# Patient Record
Sex: Female | Born: 1970 | Race: White | Hispanic: No | Marital: Married | Smoking: Never smoker
Health system: Southern US, Community
[De-identification: ages and names within clinical notes are randomized; demographics above are authoritative.]

## PROBLEM LIST (undated history)

## (undated) DIAGNOSIS — K219 Gastro-esophageal reflux disease without esophagitis: Secondary | ICD-10-CM

## (undated) DIAGNOSIS — R519 Headache, unspecified: Secondary | ICD-10-CM

## (undated) DIAGNOSIS — I10 Essential (primary) hypertension: Secondary | ICD-10-CM

## (undated) DIAGNOSIS — R112 Nausea with vomiting, unspecified: Secondary | ICD-10-CM

## (undated) DIAGNOSIS — N3010 Interstitial cystitis (chronic) without hematuria: Secondary | ICD-10-CM

## (undated) DIAGNOSIS — D259 Leiomyoma of uterus, unspecified: Secondary | ICD-10-CM

## (undated) DIAGNOSIS — K429 Umbilical hernia without obstruction or gangrene: Secondary | ICD-10-CM

## (undated) DIAGNOSIS — R7303 Prediabetes: Secondary | ICD-10-CM

## (undated) DIAGNOSIS — K5790 Diverticulosis of intestine, part unspecified, without perforation or abscess without bleeding: Secondary | ICD-10-CM

## (undated) DIAGNOSIS — R17 Unspecified jaundice: Secondary | ICD-10-CM

## (undated) DIAGNOSIS — T7840XA Allergy, unspecified, initial encounter: Secondary | ICD-10-CM

## (undated) DIAGNOSIS — S83289A Other tear of lateral meniscus, current injury, unspecified knee, initial encounter: Secondary | ICD-10-CM

## (undated) DIAGNOSIS — R748 Abnormal levels of other serum enzymes: Secondary | ICD-10-CM

## (undated) DIAGNOSIS — Z9889 Other specified postprocedural states: Secondary | ICD-10-CM

## (undated) DIAGNOSIS — K8020 Calculus of gallbladder without cholecystitis without obstruction: Secondary | ICD-10-CM

## (undated) DIAGNOSIS — F40240 Claustrophobia: Secondary | ICD-10-CM

## (undated) DIAGNOSIS — R740 Nonspecific elevation of levels of transaminase and lactic acid dehydrogenase [LDH]: Secondary | ICD-10-CM

## (undated) DIAGNOSIS — R51 Headache: Secondary | ICD-10-CM

## (undated) DIAGNOSIS — M1710 Unilateral primary osteoarthritis, unspecified knee: Secondary | ICD-10-CM

## (undated) DIAGNOSIS — M7121 Synovial cyst of popliteal space [Baker], right knee: Secondary | ICD-10-CM

## (undated) HISTORY — DX: Essential (primary) hypertension: I10

## (undated) HISTORY — DX: Diverticulosis of intestine, part unspecified, without perforation or abscess without bleeding: K57.90

## (undated) HISTORY — DX: Interstitial cystitis (chronic) without hematuria: N30.10

## (undated) HISTORY — DX: Allergy, unspecified, initial encounter: T78.40XA

---

## 1996-07-10 HISTORY — PX: COLONOSCOPY: SHX174

## 2016-02-21 DIAGNOSIS — D259 Leiomyoma of uterus, unspecified: Secondary | ICD-10-CM | POA: Insufficient documentation

## 2016-07-10 DIAGNOSIS — D259 Leiomyoma of uterus, unspecified: Secondary | ICD-10-CM

## 2016-07-10 HISTORY — PX: LIVER BIOPSY: SHX301

## 2016-07-10 HISTORY — DX: Leiomyoma of uterus, unspecified: D25.9

## 2016-11-07 HISTORY — PX: FINGER SURGERY: SHX640

## 2016-11-09 DIAGNOSIS — S62635B Displaced fracture of distal phalanx of left ring finger, initial encounter for open fracture: Secondary | ICD-10-CM | POA: Insufficient documentation

## 2016-11-10 ENCOUNTER — Ambulatory Visit (INDEPENDENT_AMBULATORY_CARE_PROVIDER_SITE_OTHER): Payer: Self-pay | Admitting: Orthopaedic Surgery

## 2017-03-02 ENCOUNTER — Encounter: Payer: Self-pay | Admitting: Gastroenterology

## 2017-03-20 ENCOUNTER — Encounter (INDEPENDENT_AMBULATORY_CARE_PROVIDER_SITE_OTHER): Payer: Self-pay

## 2017-03-20 ENCOUNTER — Encounter: Payer: Self-pay | Admitting: Gastroenterology

## 2017-03-20 ENCOUNTER — Ambulatory Visit (INDEPENDENT_AMBULATORY_CARE_PROVIDER_SITE_OTHER): Payer: PRIVATE HEALTH INSURANCE | Admitting: Gastroenterology

## 2017-03-20 VITALS — BP 120/82 | HR 78 | Ht 63.0 in | Wt 167.4 lb

## 2017-03-20 DIAGNOSIS — Z1211 Encounter for screening for malignant neoplasm of colon: Secondary | ICD-10-CM | POA: Diagnosis not present

## 2017-03-20 NOTE — Patient Instructions (Addendum)
Check with your insurance company and call back to schedule.

## 2017-03-20 NOTE — Progress Notes (Signed)
     03/20/2017 Angel Humphrey [id number] 1971-04-20   HISTORY OF PRESENT ILLNESS:  This is a 46 year old female who is new to our practice. She was sent here at the request of her PCP in order to discuss need for colonoscopy. She had a colonoscopy in [place] by Dr. Algis Greenhouse in 2005 at which time she reports only having diverticulosis. She does not have any GI complaints. She says that her family doctor recommended colonoscopy based on the new cancer society screening guidelines of age 28. She does not have a history of colon polyps as stated above and has no family history of colon cancer.   Past Medical History:  Diagnosis Date  . Diverticulosis   . Hypertension   . Interstitial cystitis    Past Surgical History:  Procedure Laterality Date  . CESAREAN SECTION      reports that she has never smoked. She has never used smokeless tobacco. She reports that she does not drink alcohol or use drugs. family history includes Graves' disease in her father; Osteosarcoma in her father. No Known Allergies    Outpatient Encounter Prescriptions as of 03/20/2017  Medication Sig  . hydrochlorothiazide (MICROZIDE) 12.5 MG capsule Take 12.5 mg by mouth daily.  Marland Kitchen losartan-hydrochlorothiazide (HYZAAR) 50-12.5 MG tablet Take 1 tablet by mouth daily.  . norethindrone (MICRONOR,CAMILA,ERRIN) 0.35 MG tablet Take 1 tablet by mouth daily.  Marland Kitchen omeprazole (PRILOSEC) 40 MG capsule Take 40 mg by mouth daily.   No facility-administered encounter medications on file as of 03/20/2017.      REVIEW OF SYSTEMS  : All other systems reviewed and negative except where noted in the History of Present Illness.   PHYSICAL EXAM: BP 120/82 (BP Location: Left Arm, Patient Position: Sitting, Cuff Size: Normal)   Pulse 78   Ht 5\' 3"  (1.6 m)   Wt 167 lb 6.4 oz (75.9 kg)   SpO2 98%   BMI 29.65 kg/m  General: Well developed white female in no acute distress Head: Normocephalic and atraumatic Eyes:  Sclerae anicteric,  conjunctiva pink. Ears: Normal auditory acuity Lungs: Clear throughout to auscultation; no increased WOB. Heart: Regular rate and rhythm Abdomen: Soft, non-distended. Normal bowel sounds.  Non-tender. Musculoskeletal: Symmetrical with no gross deformities  Skin: No lesions on visible extremities Extremities: No edema  Neurological: Alert oriented x 4, grossly non-focal Psychological:  Alert and cooperative. Normal mood and affect  ASSESSMENT AND PLAN: -Screening colonoscopy:  I do not think that colonoscopy is warranted at this time.  She is going to check with her insurance company to see if this would even be covered. If it will be covered and she decides to proceed then she can call back to schedule an appointment.   CC:  Dr. Laney Pastor

## 2017-03-20 NOTE — Progress Notes (Signed)
Reviewed and agree with initial management plan. OK to proceed with screening colonoscopy since she is over age 48 if she would like to proceed.  Await her check with her insurance provider.   Pricilla Riffle. Fuller Plan, MD [facility]

## 2017-05-10 DIAGNOSIS — K429 Umbilical hernia without obstruction or gangrene: Secondary | ICD-10-CM

## 2017-05-10 HISTORY — DX: Umbilical hernia without obstruction or gangrene: K42.9

## 2017-06-01 ENCOUNTER — Emergency Department (HOSPITAL_COMMUNITY): Payer: PRIVATE HEALTH INSURANCE

## 2017-06-01 ENCOUNTER — Inpatient Hospital Stay (HOSPITAL_COMMUNITY)
Admission: EM | Admit: 2017-06-01 | Discharge: 2017-06-05 | DRG: 441 | Disposition: A | Discharge status: Home / Self Care | Payer: PRIVATE HEALTH INSURANCE | Attending: Family Medicine | Admitting: Family Medicine

## 2017-06-01 ENCOUNTER — Other Ambulatory Visit: Payer: Self-pay

## 2017-06-01 ENCOUNTER — Encounter (HOSPITAL_COMMUNITY): Payer: Self-pay | Admitting: *Deleted

## 2017-06-01 DIAGNOSIS — K859 Acute pancreatitis without necrosis or infection, unspecified: Secondary | ICD-10-CM | POA: Diagnosis present

## 2017-06-01 DIAGNOSIS — R52 Pain, unspecified: Secondary | ICD-10-CM

## 2017-06-01 DIAGNOSIS — N390 Urinary tract infection, site not specified: Secondary | ICD-10-CM

## 2017-06-01 DIAGNOSIS — I10 Essential (primary) hypertension: Secondary | ICD-10-CM | POA: Diagnosis present

## 2017-06-01 DIAGNOSIS — R17 Unspecified jaundice: Secondary | ICD-10-CM | POA: Diagnosis not present

## 2017-06-01 DIAGNOSIS — N3010 Interstitial cystitis (chronic) without hematuria: Secondary | ICD-10-CM | POA: Diagnosis present

## 2017-06-01 DIAGNOSIS — Z79899 Other long term (current) drug therapy: Secondary | ICD-10-CM

## 2017-06-01 DIAGNOSIS — R7989 Other specified abnormal findings of blood chemistry: Secondary | ICD-10-CM | POA: Diagnosis present

## 2017-06-01 DIAGNOSIS — D259 Leiomyoma of uterus, unspecified: Secondary | ICD-10-CM | POA: Diagnosis present

## 2017-06-01 DIAGNOSIS — R740 Nonspecific elevation of levels of transaminase and lactic acid dehydrogenase [LDH]: Secondary | ICD-10-CM

## 2017-06-01 DIAGNOSIS — R823 Hemoglobinuria: Secondary | ICD-10-CM | POA: Diagnosis present

## 2017-06-01 DIAGNOSIS — E876 Hypokalemia: Secondary | ICD-10-CM | POA: Diagnosis present

## 2017-06-01 DIAGNOSIS — K429 Umbilical hernia without obstruction or gangrene: Secondary | ICD-10-CM | POA: Diagnosis present

## 2017-06-01 DIAGNOSIS — R7401 Elevation of levels of liver transaminase levels: Secondary | ICD-10-CM

## 2017-06-01 DIAGNOSIS — B179 Acute viral hepatitis, unspecified: Secondary | ICD-10-CM

## 2017-06-01 DIAGNOSIS — L299 Pruritus, unspecified: Secondary | ICD-10-CM | POA: Diagnosis present

## 2017-06-01 DIAGNOSIS — Z808 Family history of malignant neoplasm of other organs or systems: Secondary | ICD-10-CM

## 2017-06-01 LAB — PREGNANCY, URINE: Preg Test, Ur: NEGATIVE

## 2017-06-01 LAB — ETHANOL

## 2017-06-01 LAB — COMPREHENSIVE METABOLIC PANEL
ALT: 163 U/L — AB (ref 14–54)
AST: 78 U/L — AB (ref 15–41)
Albumin: 3.6 g/dL (ref 3.5–5.0)
Alkaline Phosphatase: 328 U/L — ABNORMAL HIGH (ref 38–126)
Anion gap: 11 (ref 5–15)
BUN: 7 mg/dL (ref 6–20)
CHLORIDE: 98 mmol/L — AB (ref 101–111)
CO2: 25 mmol/L (ref 22–32)
CREATININE: 0.6 mg/dL (ref 0.44–1.00)
Calcium: 9.1 mg/dL (ref 8.9–10.3)
GFR calc non Af Amer: >60 mL/min (ref >60)
Glucose, Bld: 139 mg/dL — ABNORMAL HIGH (ref 65–99)
Potassium: 2.6 mmol/L — CL (ref 3.5–5.1)
SODIUM: 134 mmol/L — AB (ref 135–145)
Total Bilirubin: 6.9 mg/dL — ABNORMAL HIGH (ref 0.3–1.2)
Total Protein: 7.7 g/dL (ref 6.5–8.1)

## 2017-06-01 LAB — CBC
HEMATOCRIT: 39.5 % (ref 36.0–46.0)
HEMOGLOBIN: 12.8 g/dL (ref 12.0–15.0)
MCH: 28.2 pg (ref 26.0–34.0)
MCHC: 32.4 g/dL (ref 30.0–36.0)
MCV: 87 fL (ref 78.0–100.0)
Platelets: 311 10*3/uL (ref 150–400)
RBC: 4.54 MIL/uL (ref 3.87–5.11)
RDW: 13.1 % (ref 11.5–15.5)
WBC: 10.7 10*3/uL — AB (ref 4.0–10.5)

## 2017-06-01 LAB — I-STAT CG4 LACTIC ACID, ED: LACTIC ACID, VENOUS: 1.3 mmol/L (ref 0.5–1.9)

## 2017-06-01 LAB — ACETAMINOPHEN LEVEL

## 2017-06-01 LAB — MAGNESIUM: MAGNESIUM: 1.8 mg/dL (ref 1.7–2.4)

## 2017-06-01 LAB — LIPASE, BLOOD: LIPASE: 177 U/L — AB (ref 11–51)

## 2017-06-01 MED ORDER — IOPAMIDOL (ISOVUE-300) INJECTION 61%
100.0000 mL | Freq: Once | INTRAVENOUS | Status: AC | PRN
  Administered 2017-06-01: 100 mL via INTRAVENOUS

## 2017-06-01 MED ORDER — SODIUM CHLORIDE 0.9 % IV SOLN
INTRAVENOUS | Status: DC
  Administered 2017-06-01: via INTRAVENOUS

## 2017-06-01 MED ORDER — POTASSIUM CHLORIDE 10 MEQ/100ML IV SOLN
10.0000 meq | INTRAVENOUS | Status: AC
  Administered 2017-06-01 – 2017-06-02 (×3): 10 meq via INTRAVENOUS
  Filled 2017-06-01 (×3): qty 100

## 2017-06-01 MED ORDER — POTASSIUM CHLORIDE CRYS ER 20 MEQ PO TBCR
40.0000 meq | EXTENDED_RELEASE_TABLET | Freq: Once | ORAL | Status: AC
  Administered 2017-06-01: 40 meq via ORAL
  Filled 2017-06-01: qty 2

## 2017-06-01 NOTE — ED Provider Notes (Signed)
[facility] EMERGENCY DEPARTMENT Provider Note   CSN: [id number] Arrival date & time: 06/01/17  2051     History   Chief Complaint Chief Complaint  Patient presents with  . Abdominal Pain    HPI Angel Humphrey is a 46 y.o. female.  Patient with past medical history of hypertension presenting with 5-day history of upper abdominal pain and nausea.  She was sent here from urgent care when he noticed she was jaundiced.  She denies any Fever or history of stomach problems or liver problems.  Patient states symptoms started 5 days ago with nausea and "acid reflux" sour taste in her mouth.  She has had poor appetite all week with discomfort in her epigastrium nausea.  No vomiting.  Did have constipation earlier in the week but now having loose and lightly colored stools.  Denies sick contacts.  Denies recent travel.  Denies IV drug abuse or excessive alcohol use.  No previous abdominal surgeries.  No chest pain or shortness of breath.  Urine has been dark but nonpainful with urination and no hematuria.   The history is provided by the patient.  Abdominal Pain   Associated symptoms include diarrhea, nausea and constipation. Pertinent negatives include fever, vomiting, dysuria, hematuria, headaches, arthralgias and myalgias.    Past Medical History:  Diagnosis Date  . Diverticulosis   . Hypertension   . Interstitial cystitis     Patient Active Problem List   Diagnosis Date Noted  . Special screening for malignant neoplasms, colon 03/20/2017    Past Surgical History:  Procedure Laterality Date  . CESAREAN SECTION      OB History    No data available       Home Medications    Prior to Admission medications   Medication Sig Start Date End Date Taking? Authorizing Provider  hydrochlorothiazide (MICROZIDE) 12.5 MG capsule Take 12.5 mg by mouth daily.    [provider]  losartan-hydrochlorothiazide (HYZAAR) 50-12.5 MG tablet Take 1 tablet by mouth daily.    [provider]  norethindrone (MICRONOR,CAMILA,ERRIN) 0.35 MG tablet Take 1 tablet by mouth daily.    [provider]  omeprazole (PRILOSEC) 40 MG capsule Take 40 mg by mouth daily.    [provider]    Family History Family History  Problem Relation Age of Onset  . Osteosarcoma Father   . Graves' disease Father     Social History Social History   Tobacco Use  . Smoking status: Never Smoker  . Smokeless tobacco: Never Used  Substance Use Topics  . Alcohol use: No  . Drug use: No     Allergies   Patient has no known allergies.   Review of Systems Review of Systems  Constitutional: Positive for activity change, appetite change and fatigue. Negative for fever.  Respiratory: Negative for cough, chest tightness and shortness of breath.   Cardiovascular: Negative for chest pain.  Gastrointestinal: Positive for abdominal pain, constipation, diarrhea and nausea. Negative for vomiting.  Genitourinary: Negative for dysuria, hematuria, vaginal bleeding and vaginal discharge.  Musculoskeletal: Negative for arthralgias and myalgias.  Skin: Negative for rash.  Neurological: Positive for weakness. Negative for dizziness and headaches.    all other systems are negative except as noted in the HPI and PMH.    Physical Exam Updated Vital Signs BP 129/80 (BP Location: Left Arm)   Pulse 81   Temp 98.4 F (36.9 C) (Oral)   Resp 18   Ht 5\' 3"  (1.6 m)   Wt  78 kg (172 lb)   SpO2 99%   BMI 30.47 kg/m   Physical Exam  Constitutional: She is oriented to person, place, and time. She appears well-developed and well-nourished. No distress.  jaundice  HENT:  Head: Normocephalic and atraumatic.  Mouth/Throat: Oropharynx is clear and moist. No oropharyngeal exudate.  Eyes: Conjunctivae and EOM are normal. Pupils are equal, round, and reactive to light. Scleral icterus is present.  Neck: Normal range of motion. Neck supple.  No meningismus.  Cardiovascular: Normal  rate, regular rhythm, normal heart sounds and intact distal pulses.  No murmur heard. Pulmonary/Chest: Effort normal and breath sounds normal. No respiratory distress.  Abdominal: Soft. There is tenderness. There is no rebound and no guarding.  TTP epigastrium and RUQ. No guarding or rebound  Musculoskeletal: Normal range of motion. She exhibits no edema or tenderness.  Neurological: She is alert and oriented to person, place, and time. No cranial nerve deficit. She exhibits normal muscle tone. Coordination normal.  No ataxia on finger to nose bilaterally. No pronator drift. 5/5 strength throughout. CN 2-12 intact.Equal grip strength. Sensation intact.   Skin: Skin is warm.  Psychiatric: She has a normal mood and affect. Her behavior is normal.  Nursing note and vitals reviewed.    ED Treatments / Results  Labs (all labs ordered are listed, but only abnormal results are displayed) Labs Reviewed  LIPASE, BLOOD - Abnormal; Notable for the following components:      Result Value   Lipase 177 (*)    All other components within normal limits  COMPREHENSIVE METABOLIC PANEL - Abnormal; Notable for the following components:   Sodium 134 (*)    Potassium 2.6 (*)    Chloride 98 (*)    Glucose, Bld 139 (*)    AST 78 (*)    ALT 163 (*)    Alkaline Phosphatase 328 (*)    Total Bilirubin 6.9 (*)    All other components within normal limits  CBC - Abnormal; Notable for the following components:   WBC 10.7 (*)    All other components within normal limits  URINALYSIS, ROUTINE W REFLEX MICROSCOPIC - Abnormal; Notable for the following components:   Color, Urine AMBER (*)    Hgb urine dipstick SMALL (*)    Bilirubin Urine SMALL (*)    Leukocytes, UA SMALL (*)    Bacteria, UA FEW (*)    Squamous Epithelial / LPF 0-5 (*)    All other components within normal limits  ACETAMINOPHEN LEVEL - Abnormal; Notable for the following components:   Acetaminophen (Tylenol), Serum <10 (*)    All other  components within normal limits  BILIRUBIN, DIRECT - Abnormal; Notable for the following components:   Bilirubin, Direct 4.2 (*)    All other components within normal limits  ETHANOL  PREGNANCY, URINE  MAGNESIUM  PHOSPHORUS  HEPATITIS PANEL, ACUTE  CBC WITH DIFFERENTIAL/PLATELET  PROTIME-INR  HEPATIC FUNCTION PANEL  BASIC METABOLIC PANEL  I-STAT CG4 LACTIC ACID, ED  I-STAT CG4 LACTIC ACID, ED    EKG  EKG Interpretation None       Radiology Ct Abdomen Pelvis W Contrast  Result Date: 06/02/2017 CLINICAL DATA:  Upper abdominal pain and nausea for 5 days. Jaundice. EXAM: CT ABDOMEN AND PELVIS WITH CONTRAST TECHNIQUE: Multidetector CT imaging of the abdomen and pelvis was performed using the standard protocol following bolus administration of intravenous contrast. CONTRAST:  124mL ISOVUE-300 IOPAMIDOL (ISOVUE-300) INJECTION 61% COMPARISON:  None. FINDINGS: Lower chest: Lung bases are clear. Hepatobiliary:  No focal liver abnormality is seen. No gallstones, gallbladder wall thickening, or biliary dilatation. Pancreas: Unremarkable. No pancreatic ductal dilatation or surrounding inflammatory changes. Spleen: Normal in size without focal abnormality. Adrenals/Urinary Tract: Adrenal glands are unremarkable. Kidneys are normal, without renal calculi, focal lesion, or hydronephrosis. Bladder is unremarkable. Stomach/Bowel: Stomach is within normal limits. Appendix is not identified. No evidence of bowel wall thickening, distention, or inflammatory changes. Scattered colonic diverticula without evidence of diverticulitis. Vascular/Lymphatic: No significant vascular findings are present. No enlarged abdominal or pelvic lymph nodes. Reproductive: Heterogeneous nodular appearance to the uterus probably representing small fibroids. Uterus and ovaries are not enlarged. Other: Small periumbilical hernia containing fat. No free air or free fluid in the abdomen. Musculoskeletal: No acute or significant  osseous findings. IMPRESSION: 1. No abnormality identified to account for history jaundice. 2. Probable uterine fibroids. 3. Small periumbilical hernia containing fat. Electronically Signed   By: Lucienne Capers M.D.   On: 06/02/2017 00:09    Procedures Procedures (including critical care time)  Medications Ordered in ED Medications - No data to display   Initial Impression / Assessment and Plan / ED Course  I have reviewed the triage vital signs and the nursing notes.  Pertinent labs & imaging results that were available during my care of the patient were reviewed by me and considered in my medical decision making (see chart for details).    Patient with 5-day history of epigastric pain nausea and poor appetite, now with jaundice.  She is nontoxic and afebrile.  Abdomen is not peritoneal.  Given IV fluids, symptom control, labs will be obtained. likely need imaging of biliary tree.  Labs show hypokalemia, elevated LFTs and lipase.  CT scan does not show any common bile duct stones or other explanation for patient's jaundice and elevated enzymes.  No fever.  Doubt cholangitis.  Patient may benefit from further evaluation of her biliary tree with ultrasound.  Suspect possible passed gallstone.  Patient appears well and nontoxic. Hepatitis panel pending.  Will plan admission for ultrasound, further workup of her hepatitis.  Discussed with Dr. Olevia Bowens. Final Clinical Impressions(s) / ED Diagnoses   Final diagnoses:  Jaundice  Transaminitis    ED Discharge Orders    None       Aiana Nordquist, Annie Main, MD 06/02/17 [phone]

## 2017-06-01 NOTE — ED Triage Notes (Signed)
Pt was seen at urgent care earlier today for upper abdominal pain and nausea x 5 days; pt states today when she woke up she noticed her skin and eyes were yellow; [facility] in [place] sent her here

## 2017-06-01 NOTE — ED Notes (Signed)
Date and time results received: 06/01/17 2317 (use smartphrase ".now" to insert current time)  Test: potassium Critical Value: 2.6  Name of Provider Notified: DR. Wyvonnia Dusky notified at 2318  Orders Received? Or Actions Taken?: no/na

## 2017-06-02 ENCOUNTER — Encounter (HOSPITAL_COMMUNITY): Payer: Self-pay | Admitting: Internal Medicine

## 2017-06-02 ENCOUNTER — Observation Stay (HOSPITAL_COMMUNITY): Payer: PRIVATE HEALTH INSURANCE

## 2017-06-02 ENCOUNTER — Other Ambulatory Visit: Payer: Self-pay

## 2017-06-02 DIAGNOSIS — R17 Unspecified jaundice: Secondary | ICD-10-CM | POA: Diagnosis present

## 2017-06-02 DIAGNOSIS — E876 Hypokalemia: Secondary | ICD-10-CM | POA: Diagnosis present

## 2017-06-02 DIAGNOSIS — N3010 Interstitial cystitis (chronic) without hematuria: Secondary | ICD-10-CM | POA: Diagnosis present

## 2017-06-02 DIAGNOSIS — I10 Essential (primary) hypertension: Secondary | ICD-10-CM | POA: Diagnosis present

## 2017-06-02 DIAGNOSIS — K859 Acute pancreatitis without necrosis or infection, unspecified: Secondary | ICD-10-CM | POA: Diagnosis present

## 2017-06-02 DIAGNOSIS — R740 Nonspecific elevation of levels of transaminase and lactic acid dehydrogenase [LDH]: Secondary | ICD-10-CM | POA: Diagnosis not present

## 2017-06-02 DIAGNOSIS — B179 Acute viral hepatitis, unspecified: Secondary | ICD-10-CM

## 2017-06-02 DIAGNOSIS — L299 Pruritus, unspecified: Secondary | ICD-10-CM | POA: Diagnosis present

## 2017-06-02 DIAGNOSIS — D259 Leiomyoma of uterus, unspecified: Secondary | ICD-10-CM | POA: Diagnosis present

## 2017-06-02 DIAGNOSIS — Z79899 Other long term (current) drug therapy: Secondary | ICD-10-CM | POA: Diagnosis not present

## 2017-06-02 DIAGNOSIS — K429 Umbilical hernia without obstruction or gangrene: Secondary | ICD-10-CM | POA: Diagnosis present

## 2017-06-02 DIAGNOSIS — R7989 Other specified abnormal findings of blood chemistry: Secondary | ICD-10-CM | POA: Diagnosis present

## 2017-06-02 DIAGNOSIS — R823 Hemoglobinuria: Secondary | ICD-10-CM | POA: Diagnosis present

## 2017-06-02 DIAGNOSIS — Z808 Family history of malignant neoplasm of other organs or systems: Secondary | ICD-10-CM | POA: Diagnosis not present

## 2017-06-02 DIAGNOSIS — N390 Urinary tract infection, site not specified: Secondary | ICD-10-CM

## 2017-06-02 LAB — URINALYSIS, ROUTINE W REFLEX MICROSCOPIC
GLUCOSE, UA: NEGATIVE mg/dL
Ketones, ur: NEGATIVE mg/dL
NITRITE: NEGATIVE
PH: 6 (ref 5.0–8.0)
Protein, ur: NEGATIVE mg/dL
SPECIFIC GRAVITY, URINE: 1.006 (ref 1.005–1.030)

## 2017-06-02 LAB — CBC WITH DIFFERENTIAL/PLATELET
BASOS ABS: 0 10*3/uL (ref 0.0–0.1)
Basophils Relative: 0 %
EOS PCT: 5 %
Eosinophils Absolute: 0.4 10*3/uL (ref 0.0–0.7)
HEMATOCRIT: 39.1 % (ref 36.0–46.0)
Hemoglobin: 12.6 g/dL (ref 12.0–15.0)
LYMPHS ABS: 0.7 10*3/uL (ref 0.7–4.0)
LYMPHS PCT: 8 %
MCH: 28.3 pg (ref 26.0–34.0)
MCHC: 32.2 g/dL (ref 30.0–36.0)
MCV: 87.9 fL (ref 78.0–100.0)
Monocytes Absolute: 0.8 10*3/uL (ref 0.1–1.0)
Monocytes Relative: 10 %
NEUTROS ABS: 6.5 10*3/uL (ref 1.7–7.7)
Neutrophils Relative %: 77 %
PLATELETS: 278 10*3/uL (ref 150–400)
RBC: 4.45 MIL/uL (ref 3.87–5.11)
RDW: 13.2 % (ref 11.5–15.5)
WBC: 8.5 10*3/uL (ref 4.0–10.5)

## 2017-06-02 LAB — BASIC METABOLIC PANEL
Anion gap: 7 (ref 5–15)
BUN: 6 mg/dL (ref 6–20)
CHLORIDE: 108 mmol/L (ref 101–111)
CO2: 24 mmol/L (ref 22–32)
CREATININE: 0.42 mg/dL — AB (ref 0.44–1.00)
Calcium: 9 mg/dL (ref 8.9–10.3)
GFR calc Af Amer: >60 mL/min (ref >60)
GFR calc non Af Amer: >60 mL/min (ref >60)
GLUCOSE: 110 mg/dL — AB (ref 65–99)
POTASSIUM: 3.4 mmol/L — AB (ref 3.5–5.1)
SODIUM: 139 mmol/L (ref 135–145)

## 2017-06-02 LAB — HEPATIC FUNCTION PANEL
ALBUMIN: 3.1 g/dL — AB (ref 3.5–5.0)
ALK PHOS: 318 U/L — AB (ref 38–126)
ALT: 147 U/L — AB (ref 14–54)
AST: 80 U/L — AB (ref 15–41)
BILIRUBIN TOTAL: 6.4 mg/dL — AB (ref 0.3–1.2)
Bilirubin, Direct: 4 mg/dL — ABNORMAL HIGH (ref 0.1–0.5)
Indirect Bilirubin: 2.4 mg/dL — ABNORMAL HIGH (ref 0.3–0.9)
TOTAL PROTEIN: 7.1 g/dL (ref 6.5–8.1)

## 2017-06-02 LAB — PHOSPHORUS: Phosphorus: 3.6 mg/dL (ref 2.5–4.6)

## 2017-06-02 LAB — PROTIME-INR
INR: 0.96
Prothrombin Time: 12.7 seconds (ref 11.4–15.2)

## 2017-06-02 LAB — BILIRUBIN, DIRECT: Bilirubin, Direct: 4.2 mg/dL — ABNORMAL HIGH (ref 0.1–0.5)

## 2017-06-02 MED ORDER — FENTANYL CITRATE (PF) 100 MCG/2ML IJ SOLN: 25.0000 ug | INTRAMUSCULAR | Status: DC | PRN

## 2017-06-02 MED ORDER — LEVOFLOXACIN IN D5W 500 MG/100ML IV SOLN
500.0000 mg | INTRAVENOUS | Status: DC
  Administered 2017-06-02: 500 mg via INTRAVENOUS
  Filled 2017-06-02: qty 100

## 2017-06-02 MED ORDER — PANTOPRAZOLE SODIUM 40 MG PO TBEC
40.0000 mg | DELAYED_RELEASE_TABLET | Freq: Every day | ORAL | Status: DC
  Administered 2017-06-02 – 2017-06-05 (×4): 40 mg via ORAL
  Filled 2017-06-02 (×4): qty 1

## 2017-06-02 MED ORDER — PROCHLORPERAZINE EDISYLATE 5 MG/ML IJ SOLN
5.0000 mg | INTRAMUSCULAR | Status: DC | PRN
  Administered 2017-06-02 – 2017-06-04 (×3): 5 mg via INTRAVENOUS
  Filled 2017-06-02 (×2): qty 2

## 2017-06-02 MED ORDER — FAMOTIDINE IN NACL 20-0.9 MG/50ML-% IV SOLN
20.0000 mg | Freq: Once | INTRAVENOUS | Status: AC
  Administered 2017-06-02: 20 mg via INTRAVENOUS
  Filled 2017-06-02: qty 50

## 2017-06-02 MED ORDER — MAGNESIUM SULFATE 2 GM/50ML IV SOLN
2.0000 g | Freq: Once | INTRAVENOUS | Status: AC
  Administered 2017-06-02: 2 g via INTRAVENOUS
  Filled 2017-06-02: qty 50

## 2017-06-02 MED ORDER — LOSARTAN POTASSIUM-HCTZ 50-12.5 MG PO TABS: 1.0000 | ORAL_TABLET | Freq: Every day | ORAL | Status: DC

## 2017-06-02 MED ORDER — POTASSIUM CHLORIDE CRYS ER 20 MEQ PO TBCR
40.0000 meq | EXTENDED_RELEASE_TABLET | ORAL | Status: AC
  Administered 2017-06-02 (×4): 40 meq via ORAL
  Filled 2017-06-02 (×4): qty 2

## 2017-06-02 MED ORDER — PROCHLORPERAZINE EDISYLATE 5 MG/ML IJ SOLN
INTRAMUSCULAR | Status: AC
  Filled 2017-06-02: qty 2

## 2017-06-02 MED ORDER — LOSARTAN POTASSIUM 50 MG PO TABS
50.0000 mg | ORAL_TABLET | Freq: Every day | ORAL | Status: DC
  Administered 2017-06-02 – 2017-06-05 (×4): 50 mg via ORAL
  Filled 2017-06-02 (×4): qty 1

## 2017-06-02 MED ORDER — NITROFURANTOIN MACROCRYSTAL 100 MG PO CAPS
100.0000 mg | ORAL_CAPSULE | Freq: Two times a day (BID) | ORAL | Status: DC
  Filled 2017-06-02 (×3): qty 1

## 2017-06-02 MED ORDER — POTASSIUM CHLORIDE IN NACL 20-0.9 MEQ/L-% IV SOLN
INTRAVENOUS | Status: DC
  Administered 2017-06-02 – 2017-06-03 (×4): via INTRAVENOUS
  Filled 2017-06-02: qty 1000

## 2017-06-02 MED ORDER — DIPHENHYDRAMINE HCL 50 MG/ML IJ SOLN
25.0000 mg | Freq: Four times a day (QID) | INTRAMUSCULAR | Status: DC | PRN
  Administered 2017-06-02 – 2017-06-05 (×10): 25 mg via INTRAVENOUS
  Filled 2017-06-02 (×10): qty 1

## 2017-06-02 MED ORDER — HYDROCHLOROTHIAZIDE 12.5 MG PO CAPS
12.5000 mg | ORAL_CAPSULE | Freq: Every day | ORAL | Status: DC
  Administered 2017-06-02 – 2017-06-05 (×4): 12.5 mg via ORAL
  Filled 2017-06-02 (×4): qty 1

## 2017-06-02 NOTE — Consult Note (Addendum)
Referring Provider: No ref. provider found Primary Care Physician:  Yvone Neu, MD Primary Gastroenterologist:  Barney Drain  Reason for Consultation:  ELEVATED LIVER ENZYMES  FULL CONSULT TO FOLLOW  Impression: ADMITTED WITH ELEVATED LIVER ENZYMES & UTI. NO EVIDENCE FOR GALLSTONES OR CBD OBSTRUCTION. MIXED PATTERN INJURY- DIFFERENTIAL DIAGNOSIS INCLUDES: NASH, LESS LIKELY ACUTE VIRAL HEPATITIS B, AUTOIMMUNE HEPATITIS, OR OVERLAP SYNDROME  Plan: 1. SUPPORTIVE CARE 2. AWAIT ACUTE HEPATITIS PANEL & U/S. 3. HFP DAILY 4. CONTINUE TO MONITOR MENTAL STATUS. 5. AVOID HEPATOTOXIC DRUGS. D/C LEVAQUIN. SWITCH TO MACRODANTIN 100 MG BID.  6. FULL LIQUID DIET.       HPI:  Pt in her usual state of health when she felt unwell on SUN NIGH MON AM. SHE FEL FATIGUED AND WEAK. SHE STARTED HAVING NAUSEA ON FRI & WENT TO URGENT CARE, THEY NOTED SHE WAS YELLOW AND SENT HER TO THE ED.  SHE HAD A CHANGE IN HER BOWEL MOVEMENTS. SHE HAD A NL BM MON BUT NOTICED SOFT CLAY COLORED STOOLS WITHIN THE LAST FEW DAYS. SHE EATS OUT A LOT AND LAST ATE OUT THUR. SHE DENIES ETOH USE OR NEW MED Rx OR OTC. SHE HAS NEVER HAD A BLOOD TRANSFUSION AND DOESN'T HAVE ANY TATTOOS. SHE HAS TROUBLE WITH CONSTIPATION.  PT DENIES FEVER, CHILLS, HEMATOCHEZIA, vomiting, melena, diarrhea, CHEST PAIN, SHORTNESS OF BREATH, abdominal pain, problems swallowing, problems with sedation, OR heartburn or indigestion.    Past Medical History:  Diagnosis Date  . Diverticulosis   . Hypertension   . Interstitial cystitis     Past Surgical History:  Procedure Laterality Date  . CESAREAN SECTION    . COLONOSCOPY  1998    Prior to Admission medications   Medication Sig Start Date End Date Taking? Authorizing Provider  hydrochlorothiazide (MICROZIDE) 12.5 MG capsule Take 12.5 mg by mouth daily.   Yes [provider]  losartan-hydrochlorothiazide (HYZAAR) 50-12.5 MG tablet Take 1 tablet by mouth daily.   Yes [provider]  norethindrone (MICRONOR,CAMILA,ERRIN) 0.35 MG tablet Take 1 tablet by mouth daily.   Yes [provider]  omeprazole (PRILOSEC) 40 MG capsule Take 40 mg by mouth daily.   Yes [provider]    Current Facility-Administered Medications  Medication Dose Route Frequency Provider Last Rate Last Dose  . 0.9 % NaCl with KCl 20 mEq/ L  infusion   Intravenous Continuous Reubin Milan, MD 125 mL/hr at 06/02/17 1100    . diphenhydrAMINE (BENADRYL) injection 25 mg  25 mg Intravenous Q6H PRN Reubin Milan, MD   25 mg at 06/02/17 1103  . fentaNYL (SUBLIMAZE) injection 25 mcg  25 mcg Intravenous Q2H PRN Reubin Milan, MD      . losartan (COZAAR) tablet 50 mg  50 mg Oral Daily Reubin Milan, MD   50 mg at 06/02/17 1105   And  . hydrochlorothiazide (MICROZIDE) capsule 12.5 mg  12.5 mg Oral Daily Reubin Milan, MD   12.5 mg at 06/02/17 1104  . levofloxacin (LEVAQUIN) IVPB 500 mg  500 mg Intravenous Q24H Reubin Milan, MD 100 mL/hr at 06/02/17 1103 500 mg at 06/02/17 1103  . pantoprazole (PROTONIX) EC tablet 40 mg  40 mg Oral Daily Reubin Milan, MD   40 mg at 06/02/17 1104  . potassium chloride SA (K-DUR,KLOR-CON) CR tablet 40 mEq  40 mEq Oral Q4H Isaac Bliss, Rayford Halsted, MD   40 mEq at 06/02/17 1104  . prochlorperazine (COMPAZINE) 5 MG/ML injection           .  prochlorperazine (COMPAZINE) injection 5 mg  5 mg Intravenous Q4H PRN Reubin Milan, MD   5 mg at 06/02/17 0118    Allergies as of 06/01/2017  . (No Known Allergies)    Family History  Problem Relation Age of Onset  . Osteosarcoma Father   . Graves' disease Father     Social History   Socioeconomic History  . Marital status: Married    Spouse name: Not on file  . Number of children: Not on file  . Years of education: Not on file  . Highest education level: Not on file  Social Needs  . Financial resource strain: Not on file  . Food insecurity - worry:  Not on file  . Food insecurity - inability: Not on file  . Transportation needs - medical: Not on file  . Transportation needs - non-medical: Not on file  Occupational History  . Not on file  Tobacco Use  . Smoking status: Never Smoker  . Smokeless tobacco: Never Used  Substance and Sexual Activity  . Alcohol use: No  . Drug use: No  . Sexual activity: Not on file  Other Topics Concern  . Not on file  Social History Narrative  . Not on file    Review of Systems: PER HPI OTHERWISE ALL SYSTEMS ARE NEGATIVE.   Vitals: Blood pressure 132/71, pulse 87, temperature 98.4 F (36.9 C), temperature source Oral, resp. rate 19, height 5\' 3"  (1.6 m), weight 170 lb 3.2 oz (77.2 kg), SpO2 98 %.  Physical Exam: General:   Alert,  Well-developed, well-nourished, pleasant and cooperative in NAD Head:  Normocephalic and atraumatic. Eyes:  Sclera clear, icterus.   Conjunctiva pink. Mouth:  No lesions, dentition normal. Neck:  Supple; no masses. Lungs:  Clear throughout to auscultation.   No wheezes. No acute distress. Heart:  Regular rate and rhythm; no murmurs. Abdomen:  Soft, MILDLY tender IN BUQs/EPIGASTRIUM, nondistended. No masses,  noted. Normal bowel sounds, without guarding, and without rebound.   Msk:  Symmetrical without gross deformities. Normal posture. Extremities:  Without edema. Neurologic:  Alert and  oriented x4;  grossly normal neurologically. Cervical Nodes:  No significant cervical adenopathy. Psych:  Alert and cooperative. Normal mood and affect.   Lab Results: PENDING ACUTE HEPATITIS PANEL Recent Labs    06/01/17 2225 06/02/17 0950  WBC 10.7* 8.5  HGB 12.8 12.6  HCT 39.5 39.1  PLT 311 278   BMET Recent Labs    06/01/17 2225 06/02/17 0950  NA 134* 139  K 2.6* 3.4*  CL 98* 108  CO2 25 24  GLUCOSE 139* 110*  BUN 7 6  CREATININE 0.60 0.42*  CALCIUM 9.1 9.0   LFT Recent Labs    06/02/17 0950  PROT 7.1  ALBUMIN 3.1*  AST 80*  ALT 147*  ALKPHOS  318*  BILITOT 6.4*  BILIDIR 4.0*  IBILI 2.4*     Studies/Results: PENDING U/S   LOS: 0 days   Arick Mareno  06/02/2017, 11:06 AM

## 2017-06-02 NOTE — H&P (Signed)
History and Physical    [name] DJS:[id number] DOB: [birth year] DOA: 06/01/2017  PCP: Yvone Neu, MD   Patient coming from: [facility], [place], [place].  I have personally briefly reviewed patient's old medical records in [place]  Chief Complaint: Abdominal pain, nausea and jaundice.  HPI: Angel Humphrey is a 46 y.o. female with medical history significant of diverticulosis, hypertension, interstitial cystitis who is coming to the emergency department referred from [facility] urgent care in [place], [place] after presenting there with complaints of 5 days of right upper abdominal quadrant pain associated with nausea, mild malaise, decreased appetite, and jaundice that she noticed today.  She denies fever, chills, sore throat, chest pain, dizziness, palpitations, diaphoresis, PND orthopnea.  She gets occasional lower extremity edema.  Denies melena, hematochezia or constipation.  She gets occasional frequency, but currently no new symptoms.  ED Course: Initial vital signs temperature 36.9C, pulse 81, respirations 18, blood pressure 129/80 and O2 sat 99% on room air.  She received potassium supplementation in the emergency department.  Her workup shows urinalysis with amber colored, small hemoglobinuria, small bilirubin, small leukocyte esterase.  Microscopic shows few bacteria with 6-30 WBC.  WBC of 10.7, hemoglobin 12.8 g/dL and platelets 311.  Her sodium was 134, potassium 2.6, CO2 25 and chloride 98 mmol/L.  AST was 78, ALT 163, alkaline phosphatase 328 and lipase 177.  Her total bilirubin was 6.9 and direct bilirubin was 4.2.  Acetaminophen, alcohol, magnesium, phosphorus and lactic acid levels were normal.  Imaging: CT abdomen/pelvis with contrast show probable uterine fibroids and small periumbilical hernia containing fat, but no abnormality that could account for jaundice.  Please see images and full radiology report for further detail.  Review of Systems: As per  HPI otherwise 10 point review of systems negative.    Past Medical History:  Diagnosis Date  . Diverticulosis   . Hypertension   . Interstitial cystitis     Past Surgical History:  Procedure Laterality Date  . CESAREAN SECTION       reports that  has never smoked. she has never used smokeless tobacco. She reports that she does not drink alcohol or use drugs.  No Known Allergies  Family History  Problem Relation Age of Onset  . Osteosarcoma Father   . Graves' disease Father     Prior to Admission medications   Medication Sig Start Date End Date Taking? Authorizing Provider  hydrochlorothiazide (MICROZIDE) 12.5 MG capsule Take 12.5 mg by mouth daily.    [provider]  losartan-hydrochlorothiazide (HYZAAR) 50-12.5 MG tablet Take 1 tablet by mouth daily.    [provider]  norethindrone (MICRONOR,CAMILA,ERRIN) 0.35 MG tablet Take 1 tablet by mouth daily.    [provider]  omeprazole (PRILOSEC) 40 MG capsule Take 40 mg by mouth daily.    [provider]    Physical Exam: Vitals:   06/01/17 2133 06/01/17 2134 06/02/17 0100 06/02/17 0144  BP: 129/80  119/67 (!) 143/61  Pulse: 81  79 83  Resp: 18  (!) 23 18  Temp: 98.4 F (36.9 C)   97.6 F (36.4 C)  TempSrc: Oral   Oral  SpO2: 99%  100% 97%  Weight:  78 kg (172 lb)  77.2 kg (170 lb 3.2 oz)  Height:  5\' 3"  (1.6 m)  5\' 3"  (1.6 m)    Constitutional: NAD, calm, comfortable Eyes: PERRL, icteric sclerae, lids and conjunctivae normal ENMT: Mucous membranes are moist. Posterior pharynx clear of any  exudate or lesions. Neck: normal, supple, no masses, no thyromegaly Respiratory: clear to auscultation bilaterally, no wheezing, no crackles. Normal respiratory effort. No accessory muscle use.  Cardiovascular: Regular rate and rhythm, no murmurs / rubs / gallops. No extremity edema. 2+ pedal pulses. No carotid bruits.  Abdomen: Soft, mild epigastric and RUQ tenderness, positive suprapubic  tenderness, no guarding/rebound/masses palpated. No hepatosplenomegaly. Bowel sounds positive.  Musculoskeletal: no clubbing / cyanosis. Good ROM, no contractures. Normal muscle tone.  Skin: Positive icterus. Neurologic: CN 2-12 grossly intact. Sensation intact, DTR normal. Strength 5/5 in all 4.  Psychiatric: Normal judgment and insight. Alert and oriented x 3. Normal mood.    Labs on Admission: I have personally reviewed following labs and imaging studies  CBC: Recent Labs  Lab 06/01/17 2225  WBC 10.7*  HGB 12.8  HCT 39.5  MCV 87.0  PLT 433   Basic Metabolic Panel: Recent Labs  Lab 06/01/17 2225  NA 134*  K 2.6*  CL 98*  CO2 25  GLUCOSE 139*  BUN 7  CREATININE 0.60  CALCIUM 9.1  MG 1.8  PHOS 3.6   GFR: Estimated Creatinine Clearance: 84.6 mL/min (by C-G formula based on SCr of 0.6 mg/dL). Liver Function Tests: Recent Labs  Lab 06/01/17 2225  AST 78*  ALT 163*  ALKPHOS 328*  BILITOT 6.9*  PROT 7.7  ALBUMIN 3.6   Recent Labs  Lab 06/01/17 2225  LIPASE 177*   No results for input(s): AMMONIA in the last 168 hours. Coagulation Profile: No results for input(s): INR, PROTIME in the last 168 hours. Cardiac Enzymes: No results for input(s): CKTOTAL, CKMB, CKMBINDEX, TROPONINI in the last 168 hours. BNP (last 3 results) No results for input(s): PROBNP in the last 8760 hours. HbA1C: No results for input(s): HGBA1C in the last 72 hours. CBG: No results for input(s): GLUCAP in the last 168 hours. Lipid Profile: No results for input(s): CHOL, HDL, LDLCALC, TRIG, CHOLHDL, LDLDIRECT in the last 72 hours. Thyroid Function Tests: No results for input(s): TSH, T4TOTAL, FREET4, T3FREE, THYROIDAB in the last 72 hours. Anemia Panel: No results for input(s): VITAMINB12, FOLATE, FERRITIN, TIBC, IRON, RETICCTPCT in the last 72 hours. Urine analysis:    Component Value Date/Time   COLORURINE AMBER (A) 06/01/2017 2247   APPEARANCEUR CLEAR 06/01/2017 2247   LABSPEC  1.006 06/01/2017 2247   PHURINE 6.0 06/01/2017 2247   GLUCOSEU NEGATIVE 06/01/2017 2247   HGBUR SMALL (A) 06/01/2017 2247   BILIRUBINUR SMALL (A) 06/01/2017 2247   [place] NEGATIVE 06/01/2017 2247   PROTEINUR NEGATIVE 06/01/2017 2247   NITRITE NEGATIVE 06/01/2017 2247   LEUKOCYTESUR SMALL (A) 06/01/2017 2247    Radiological Exams on Admission: Ct Abdomen Pelvis W Contrast  Result Date: 06/02/2017 CLINICAL DATA:  Upper abdominal pain and nausea for 5 days. Jaundice. EXAM: CT ABDOMEN AND PELVIS WITH CONTRAST TECHNIQUE: Multidetector CT imaging of the abdomen and pelvis was performed using the standard protocol following bolus administration of intravenous contrast. CONTRAST:  141mL ISOVUE-300 IOPAMIDOL (ISOVUE-300) INJECTION 61% COMPARISON:  None. FINDINGS: Lower chest: Lung bases are clear. Hepatobiliary: No focal liver abnormality is seen. No gallstones, gallbladder wall thickening, or biliary dilatation. Pancreas: Unremarkable. No pancreatic ductal dilatation or surrounding inflammatory changes. Spleen: Normal in size without focal abnormality. Adrenals/Urinary Tract: Adrenal glands are unremarkable. Kidneys are normal, without renal calculi, focal lesion, or hydronephrosis. Bladder is unremarkable. Stomach/Bowel: Stomach is within normal limits. Appendix is not identified. No evidence of bowel wall thickening, distention, or inflammatory changes. Scattered colonic diverticula without  evidence of diverticulitis. Vascular/Lymphatic: No significant vascular findings are present. No enlarged abdominal or pelvic lymph nodes. Reproductive: Heterogeneous nodular appearance to the uterus probably representing small fibroids. Uterus and ovaries are not enlarged. Other: Small periumbilical hernia containing fat. No free air or free fluid in the abdomen. Musculoskeletal: No acute or significant osseous findings. IMPRESSION: 1. No abnormality identified to account for history jaundice. 2. Probable uterine  fibroids. 3. Small periumbilical hernia containing fat. Electronically Signed   By: Lucienne Capers M.D.   On: 06/02/2017 00:09    EKG: Independently reviewed.   Assessment/Plan Principal Problem:   Acute hepatitis Likely due to obstruction induced cholestasis.  No history of known exposure, blood transfusions, has hep B vaccine. Observation/telemetry. Continue IV fluids. Keep n.p.o. for now. RUQ ultrasound in the morning. May have clear liquids diet after ultrasound is completed. Follow-up hepatic function panel in the morning. Follow-up acute hepatitis panel. Follow-up CBC and BMP. Consider MRCP and/or GI/hepatology evaluation if no improvement.  Active Problems:   Acute pancreatitis Elevated lipase in the presence of cholestatic jaundice. No signs of pancreatitis on CT scan. Follow-up lipase level. I advised the patient and her husband to consider a liquid or bland diet  low on fat and daily products for a few weeks after discharge.    UTI (urinary tract infection) Positive suprapubic tenderness. Levaquin 500 mg IVPB every 24 hours. Switch to oral levofloxacin once tolerating oral intake. Urine culture ordered to be done before infusing antibiotic.    Hypertension Continue losartan 50 mg p.o. daily. Decrease hydrochlorothiazide to 12.5 mg p.o. daily. The patient should take a potassium replacement on a regular basis.    Interstitial cystitis No significant symptoms at this time. Consider oxybutynin as needed if she develops symptoms.    Hypokalemia Given oral replacement of 40 mEq in ED. Currently getting IV supplementation. Magnesium supplementation given as well. Follow-up potassium level.   DVT prophylaxis: SCDs. Code Status: Full code. Family Communication: Her husband Elta Guadeloupe was present in her room. Disposition Plan: Observation for IV hydration, symptoms management and further workup. Consults called:  Admission status: Observation/telemetry.   Reubin Milan MD [facility] Pager [phone].  If 7PM-7AM, please contact night-coverage [email or web address] Password [email or web address]  06/02/2017, 2:16 AM

## 2017-06-02 NOTE — Progress Notes (Signed)
Patient briefly seen and examined, database reviewed.  Patient sent from urgent care in [facility] due to jaundice and elevated LFTs.  No signs of obstructive pathology on CT scan.  Await results of right upper quadrant ultrasound as well as acute hepatitis viral panel.  We will request GI consultation.  Recheck LFTs in a.m.  We will continue to follow.  Domingo Mend, MD [facility] Pager: [phone]

## 2017-06-02 NOTE — ED Notes (Signed)
Report given to [facility], pt transferred to 300 room 327.  Husband to bedside.

## 2017-06-03 DIAGNOSIS — R17 Unspecified jaundice: Principal | ICD-10-CM

## 2017-06-03 LAB — COMPREHENSIVE METABOLIC PANEL
ALT: 158 U/L — ABNORMAL HIGH (ref 14–54)
AST: 99 U/L — ABNORMAL HIGH (ref 15–41)
Albumin: 3.3 g/dL — ABNORMAL LOW (ref 3.5–5.0)
Alkaline Phosphatase: 344 U/L — ABNORMAL HIGH (ref 38–126)
Anion gap: 8 (ref 5–15)
BILIRUBIN TOTAL: 7.3 mg/dL — AB (ref 0.3–1.2)
CHLORIDE: 106 mmol/L (ref 101–111)
CO2: 24 mmol/L (ref 22–32)
CREATININE: 0.38 mg/dL — AB (ref 0.44–1.00)
Calcium: 9.5 mg/dL (ref 8.9–10.3)
Glucose, Bld: 118 mg/dL — ABNORMAL HIGH (ref 65–99)
POTASSIUM: 4.3 mmol/L (ref 3.5–5.1)
Sodium: 138 mmol/L (ref 135–145)
TOTAL PROTEIN: 7.2 g/dL (ref 6.5–8.1)

## 2017-06-03 LAB — URINE CULTURE: CULTURE: NO GROWTH

## 2017-06-03 LAB — CBC
HCT: 38.3 % (ref 36.0–46.0)
Hemoglobin: 12 g/dL (ref 12.0–15.0)
MCH: 28 pg (ref 26.0–34.0)
MCHC: 31.3 g/dL (ref 30.0–36.0)
MCV: 89.5 fL (ref 78.0–100.0)
PLATELETS: 327 10*3/uL (ref 150–400)
RBC: 4.28 MIL/uL (ref 3.87–5.11)
RDW: 13.5 % (ref 11.5–15.5)
WBC: 7.9 10*3/uL (ref 4.0–10.5)

## 2017-06-03 LAB — HEPATITIS PANEL, ACUTE
HCV Ab: 0.3 {s_co_ratio} (ref 0.0–0.9)
Hep A IgM: NEGATIVE
Hep B C IgM: NEGATIVE
Hepatitis B Surface Ag: NEGATIVE

## 2017-06-03 MED ORDER — NITROFURANTOIN MONOHYD MACRO 100 MG PO CAPS
100.0000 mg | ORAL_CAPSULE | Freq: Two times a day (BID) | ORAL | Status: DC
  Administered 2017-06-03: 100 mg via ORAL

## 2017-06-03 MED ORDER — NITROFURANTOIN MACROCRYSTAL 100 MG PO CAPS
100.0000 mg | ORAL_CAPSULE | Freq: Two times a day (BID) | ORAL | Status: DC
  Filled 2017-06-03 (×3): qty 1

## 2017-06-03 NOTE — Progress Notes (Signed)
PROGRESS NOTE    Angel Humphrey  NFA:[id number] DOB: 03-21-1971 DOA: 06/01/2017 PCP: Yvone Neu, MD     Brief Narrative:  46 year old woman admitted  on 11/24 after being sent to the ED from urgent care due to abdominal pain, nausea and jaundice.  Admission was requested.   Assessment & Plan:   Principal Problem:   Jaundice Active Problems:   Hypertension   Interstitial cystitis   Hypokalemia   Acute pancreatitis   UTI (urinary tract infection)   Jaundice/transaminitis -LFTs continue to increase today, bilirubin is up to 7.2. -Right upper quadrant ultrasound shows a mildly dilated common bile duct.  Further review of the CT by this radiologist shows concern for CBD stone. -Discussed case with GI, plan for MRCP tomorrow morning.  Patient will be kept n.p.o. after midnight in case ERCP is needed. -There is also possibly some mild acute pancreatitis based on elevated lipase in the presence of cholestatic jaundice.  Questionable UTI -I do not believe patient has UTI, will discontinue antibiotics.  Hypokalemia -Adequately replaced.  Hypertension -Well-controlled, continue losartan.   DVT prophylaxis: SCDs Code Status: Full code Family Communication: Multiple family members at bedside including husband updated on plan of care and all questions answered Disposition Plan: Pending medical stability  Consultants:   GI  Procedures:   None  Antimicrobials:  Anti-infectives (From admission, onward)   Start     Dose/Rate Route Frequency Ordered Stop   06/02/17 0800  levofloxacin (LEVAQUIN) IVPB 500 mg  Status:  Discontinued     500 mg 100 mL/hr over 60 Minutes Intravenous Every 24 hours 06/02/17 0704 06/02/17 1113       Subjective: Still with mild right upper quadrant pain, no emesis.  Objective: Vitals:   06/02/17 0500 06/02/17 2137 06/03/17 0523 06/03/17 1600  BP: 132/71 117/67 130/65 121/86  Pulse: 87 92 82 96  Resp: 19 19 18 18   Temp: 98.4 F  (36.9 C) 99.4 F (37.4 C) 98.4 F (36.9 C) 99.6 F (37.6 C)  TempSrc: Oral Oral  Oral  SpO2: 98% 98% 98% 97%  Weight:      Height:        Intake/Output Summary (Last 24 hours) at 06/03/2017 1704 Last data filed at 06/03/2017 1216 Gross per 24 hour  Intake 480 ml  Output 2600 ml  Net -2120 ml   Filed Weights   06/01/17 2134 06/02/17 0144  Weight: 78 kg (172 lb) 77.2 kg (170 lb 3.2 oz)    Examination:  General exam: Alert, awake, oriented x 3 Respiratory system: Clear to auscultation. Respiratory effort normal. Cardiovascular system:RRR. No murmurs, rubs, gallops. Gastrointestinal system: Abdomen is nondistended, soft and nontender mildly tender to palpation to the right upper quadrant. No organomegaly or masses felt. Normal bowel sounds heard. Central nervous system: Alert and oriented. No focal neurological deficits. Extremities: No C/C/E, +pedal pulses Skin: No rashes, lesions or ulcers Psychiatry: Judgement and insight appear normal. Mood & affect appropriate.     Data Reviewed: I have personally reviewed following labs and imaging studies  CBC: Recent Labs  Lab 06/01/17 2225 06/02/17 0950 06/03/17 0742  WBC 10.7* 8.5 7.9  NEUTROABS  --  6.5  --   HGB 12.8 12.6 12.0  HCT 39.5 39.1 38.3  MCV 87.0 87.9 89.5  PLT 311 278 469   Basic Metabolic Panel: Recent Labs  Lab 06/01/17 2225 06/02/17 0950 06/03/17 0742  NA 134* 139 138  K 2.6* 3.4* 4.3  CL 98* 108 106  CO2  25 24 24   GLUCOSE 139* 110* 118*  BUN 7 6 <5*  CREATININE 0.60 0.42* 0.38*  CALCIUM 9.1 9.0 9.5  MG 1.8  --   --   PHOS 3.6  --   --    GFR: Estimated Creatinine Clearance: 84.6 mL/min (A) (by C-G formula based on SCr of 0.38 mg/dL (L)). Liver Function Tests: Recent Labs  Lab 06/01/17 2225 06/02/17 0950 06/03/17 0742  AST 78* 80* 99*  ALT 163* 147* 158*  ALKPHOS 328* 318* 344*  BILITOT 6.9* 6.4* 7.3*  PROT 7.7 7.1 7.2  ALBUMIN 3.6 3.1* 3.3*   Recent Labs  Lab 06/01/17 2225    LIPASE 177*   No results for input(s): AMMONIA in the last 168 hours. Coagulation Profile: Recent Labs  Lab 06/02/17 0950  INR 0.96   Cardiac Enzymes: No results for input(s): CKTOTAL, CKMB, CKMBINDEX, TROPONINI in the last 168 hours. BNP (last 3 results) No results for input(s): PROBNP in the last 8760 hours. HbA1C: No results for input(s): HGBA1C in the last 72 hours. CBG: No results for input(s): GLUCAP in the last 168 hours. Lipid Profile: No results for input(s): CHOL, HDL, LDLCALC, TRIG, CHOLHDL, LDLDIRECT in the last 72 hours. Thyroid Function Tests: No results for input(s): TSH, T4TOTAL, FREET4, T3FREE, THYROIDAB in the last 72 hours. Anemia Panel: No results for input(s): VITAMINB12, FOLATE, FERRITIN, TIBC, IRON, RETICCTPCT in the last 72 hours. Urine analysis:    Component Value Date/Time   COLORURINE AMBER (A) 06/01/2017 2247   APPEARANCEUR CLEAR 06/01/2017 2247   LABSPEC 1.006 06/01/2017 2247   PHURINE 6.0 06/01/2017 2247   GLUCOSEU NEGATIVE 06/01/2017 2247   HGBUR SMALL (A) 06/01/2017 2247   BILIRUBINUR SMALL (A) 06/01/2017 2247   [place] 06/01/2017 2247   PROTEINUR NEGATIVE 06/01/2017 2247   NITRITE NEGATIVE 06/01/2017 2247   LEUKOCYTESUR SMALL (A) 06/01/2017 2247   Sepsis Labs: @LABRCNTIP (procalcitonin:4,lacticidven:4)  ) Recent Results (from the past 240 hour(s))  Culture, Urine     Status: None   Collection Time: 06/02/17 12:22 PM  Result Value Ref Range Status   Specimen Description URINE, CLEAN CATCH  Final   Special Requests NONE  Final   Culture   Final    NO GROWTH Performed at [facility], [place] [address], [place], [place] [address]    Report Status 06/03/2017 FINAL  Final         Radiology Studies: Ct Abdomen Pelvis W Contrast  Result Date: 06/02/2017 CLINICAL DATA:  Upper abdominal pain and nausea for 5 days. Jaundice. EXAM: CT ABDOMEN AND PELVIS WITH CONTRAST TECHNIQUE: Multidetector CT imaging of the abdomen  and pelvis was performed using the standard protocol following bolus administration of intravenous contrast. CONTRAST:  165mL ISOVUE-300 IOPAMIDOL (ISOVUE-300) INJECTION 61% COMPARISON:  None. FINDINGS: Lower chest: Lung bases are clear. Hepatobiliary: No focal liver abnormality is seen. No gallstones, gallbladder wall thickening, or biliary dilatation. Pancreas: Unremarkable. No pancreatic ductal dilatation or surrounding inflammatory changes. Spleen: Normal in size without focal abnormality. Adrenals/Urinary Tract: Adrenal glands are unremarkable. Kidneys are normal, without renal calculi, focal lesion, or hydronephrosis. Bladder is unremarkable. Stomach/Bowel: Stomach is within normal limits. Appendix is not identified. No evidence of bowel wall thickening, distention, or inflammatory changes. Scattered colonic diverticula without evidence of diverticulitis. Vascular/Lymphatic: No significant vascular findings are present. No enlarged abdominal or pelvic lymph nodes. Reproductive: Heterogeneous nodular appearance to the uterus probably representing small fibroids. Uterus and ovaries are not enlarged. Other: Small periumbilical hernia containing fat. No  free air or free fluid in the abdomen. Musculoskeletal: No acute or significant osseous findings. IMPRESSION: 1. No abnormality identified to account for history jaundice. 2. Probable uterine fibroids. 3. Small periumbilical hernia containing fat. Electronically Signed   By: Lucienne Capers M.D.   On: 06/02/2017 00:09   US Abdomen Limited Ruq  Result Date: 06/02/2017 CLINICAL DATA:  Acute hepatitis. Evaluate for gallstone. Upper back and epigastric pain. EXAM: ULTRASOUND ABDOMEN LIMITED RIGHT UPPER QUADRANT COMPARISON:  Abdominal CT 06/01/2017. FINDINGS: Gallbladder: No gallstones or wall thickening visualized. No sonographic Murphy sign noted by sonographer. Common bile duct: Diameter: 0.7 cm. Review of the recent CT demonstrates a small focus of gas in the  common bile duct. Findings raise concern for a small CBD stone at this location. Liver: No focal lesion identified. Within normal limits in parenchymal echogenicity. Portal vein is patent on color Doppler imaging with normal direction of blood flow towards the liver. IMPRESSION: Common bile duct is mildly dilated. Review of recent CT raises concern for a CBD stone. Consider further characterization with MRCP. No gallstones. Electronically Signed   By: Markus Daft M.D.   On: 06/02/2017 12:17        Scheduled Meds: . losartan  50 mg Oral Daily   And  . hydrochlorothiazide  12.5 mg Oral Daily  . nitrofurantoin (macrocrystal-monohydrate)  100 mg Oral Q12H  . pantoprazole  40 mg Oral Daily   Continuous Infusions: . 0.9 % NaCl with KCl 20 mEq / L 50 mL/hr at 06/03/17 1216     LOS: 1 day    Time spent: 35 minutes. Greater than 50% of this time was spent in direct contact with the patient coordinating care.     Lelon Frohlich, MD [facility] Pager [phone]  If 7PM-7AM, please contact night-coverage [email or web address] Password [email or web address] 06/03/2017, 5:04 PM

## 2017-06-03 NOTE — Progress Notes (Addendum)
Patient ID: Angel Humphrey, female   DOB: 11/23/1970, 46 y.o.   MRN: [id number]   Assessment/Plan: Admitted with elevated LIVER ENZYMES-MIXED PATTERN VIRAL SEROLOGIES ARE NEGATIVE. RUQ [place] SUGGESTS CBD STONE, BUT I PERSONALLY REVIEWED THE CT & U/S WITH DR. Garald Balding ERROR IN INTERPRETATION OF U/S & HE DID NOT SEE CBD STONE.  PLAN: 1. WILL OBTAIN MRCP & LOW LIKELIHOOD THAT ERCP WILL BE NEEDED ON MON NOV 26. 2. ADVANCE TO DYSPHAGIA 3 DIET THEN NPO AFTER MN EXCEPT MED     Subjective: Since I last evaluated the patient SHE C/O NAUSEA LAST NIGHT. FEELS HUNGRY. NO ABDOMINAL PAIN OR VOMITING.  Objective: Vital signs in last 24 hours: Vitals:   06/02/17 2137 06/03/17 0523  BP: 117/67 130/65  Pulse: 92 82  Resp: 19 18  Temp: 99.4 F (37.4 C) 98.4 F (36.9 C)  SpO2: 98% 98%   General appearance: alert, cooperative and no distress Resp: clear to auscultation bilaterally Cardio: regular rate and rhythm GI: soft, non-tender; bowel sounds normal;  Lab Results: T Bili 7.2 AST 158 ALT 99 Alk Phos 344    Studies/Results: RUQ [place] NOV 24-?CBD STONE  Medications: I have reviewed the patient's current medications.   LOS: 5 days   [employer]

## 2017-06-04 ENCOUNTER — Inpatient Hospital Stay (HOSPITAL_COMMUNITY): Payer: PRIVATE HEALTH INSURANCE

## 2017-06-04 DIAGNOSIS — R7401 Elevation of levels of liver transaminase levels: Secondary | ICD-10-CM

## 2017-06-04 DIAGNOSIS — I10 Essential (primary) hypertension: Secondary | ICD-10-CM

## 2017-06-04 DIAGNOSIS — E876 Hypokalemia: Secondary | ICD-10-CM

## 2017-06-04 DIAGNOSIS — R740 Nonspecific elevation of levels of transaminase and lactic acid dehydrogenase [LDH]: Secondary | ICD-10-CM

## 2017-06-04 LAB — COMPREHENSIVE METABOLIC PANEL
ALK PHOS: 384 U/L — AB (ref 38–126)
ALT: 176 U/L — AB (ref 14–54)
AST: 120 U/L — AB (ref 15–41)
Albumin: 3.4 g/dL — ABNORMAL LOW (ref 3.5–5.0)
Anion gap: 11 (ref 5–15)
BILIRUBIN TOTAL: 7.2 mg/dL — AB (ref 0.3–1.2)
BUN: 7 mg/dL (ref 6–20)
CALCIUM: 9.4 mg/dL (ref 8.9–10.3)
CO2: 24 mmol/L (ref 22–32)
CREATININE: 0.48 mg/dL (ref 0.44–1.00)
Chloride: 102 mmol/L (ref 101–111)
GFR calc Af Amer: >60 mL/min (ref >60)
Glucose, Bld: 144 mg/dL — ABNORMAL HIGH (ref 65–99)
Potassium: 3.7 mmol/L (ref 3.5–5.1)
Sodium: 137 mmol/L (ref 135–145)
TOTAL PROTEIN: 7.6 g/dL (ref 6.5–8.1)

## 2017-06-04 LAB — CBC
HEMATOCRIT: 40.6 % (ref 36.0–46.0)
Hemoglobin: 12.8 g/dL (ref 12.0–15.0)
MCH: 27.9 pg (ref 26.0–34.0)
MCHC: 31.5 g/dL (ref 30.0–36.0)
MCV: 88.5 fL (ref 78.0–100.0)
Platelets: 362 10*3/uL (ref 150–400)
RBC: 4.59 MIL/uL (ref 3.87–5.11)
RDW: 13.6 % (ref 11.5–15.5)
WBC: 9.5 10*3/uL (ref 4.0–10.5)

## 2017-06-04 LAB — IRON AND TIBC
IRON: 79 ug/dL (ref 28–170)
Saturation Ratios: 18 % (ref 10.4–31.8)
TIBC: 447 ug/dL (ref 250–450)
UIBC: 368 ug/dL

## 2017-06-04 LAB — PROTIME-INR
INR: 0.93
Prothrombin Time: 12.4 seconds (ref 11.4–15.2)

## 2017-06-04 LAB — TRANSFERRIN: Transferrin: 319 mg/dL (ref 192–382)

## 2017-06-04 LAB — LIPASE, BLOOD: Lipase: 87 U/L — ABNORMAL HIGH (ref 11–51)

## 2017-06-04 LAB — FERRITIN: FERRITIN: 250 ng/mL (ref 11–307)

## 2017-06-04 MED ORDER — GADOBENATE DIMEGLUMINE 529 MG/ML IV SOLN
15.0000 mL | Freq: Once | INTRAVENOUS | Status: AC | PRN
  Administered 2017-06-04: 15 mL via INTRAVENOUS

## 2017-06-04 NOTE — Progress Notes (Signed)
Subjective: Noted right-sided/RLQ abdominal discomfort yesterday evening. Mild nausea. MRCP completed this morning with CBD 6 mm, no evidence of CBD stones. Normal pancreas. Notes itching intermittently. States her urine is darker than yesterday, tea-colored.   Objective: Vital signs in last 24 hours: Temp:  [98.2 F (36.8 C)-99.6 F (37.6 C)] 98.2 F (36.8 C) (11/26 0557) Pulse Rate:  [85-97] 85 (11/26 0557) Resp:  [18-20] 20 (11/26 0557) BP: (115-134)/(64-86) 134/77 (11/26 0557) SpO2:  [97 %-98 %] 98 % (11/26 0557) Last BM Date: 06/03/17 General:   Alert and oriented, pleasant, jaundiced  Head:  Normocephalic and atraumatic. Eyes:  +scleral icterus  Mouth:  Without lesions, mucosa pink and moist.  Abdomen:  Bowel sounds present, soft, mild TTP RUQ/RLQ, no rebound or guarding Msk:  Symmetrical without gross deformities. Normal posture. Extremities:  Without edema. Neurologic:  Alert and  oriented x4 Psych:  Alert and cooperative. Normal mood and affect.  Intake/Output from previous day: 11/25 0701 - 11/26 0700 In: 240 [P.O.:240] Out: 2900 [Urine:2900] Intake/Output this shift: No intake/output data recorded.  Lab Results: Recent Labs    06/02/17 0950 06/03/17 0742 06/04/17 0637  WBC 8.5 7.9 9.5  HGB 12.6 12.0 12.8  HCT 39.1 38.3 40.6  PLT 278 327 362   BMET Recent Labs    06/02/17 0950 06/03/17 0742 06/04/17 0637  NA 139 138 137  K 3.4* 4.3 3.7  CL 108 106 102  CO2 24 24 24   GLUCOSE 110* 118* 144*  BUN 6 <5* 7  CREATININE 0.42* 0.38* 0.48  CALCIUM 9.0 9.5 9.4   LFT Recent Labs    06/01/17 2225 06/02/17 0950 06/03/17 0742 06/04/17 0637  PROT 7.7 7.1 7.2 7.6  ALBUMIN 3.6 3.1* 3.3* 3.4*  AST 78* 80* 99* 120*  ALT 163* 147* 158* 176*  ALKPHOS 328* 318* 344* 384*  BILITOT 6.9* 6.4* 7.3* 7.2*  BILIDIR 4.2* 4.0*  --   --   IBILI  --  2.4*  --   --    PT/INR Recent Labs    06/02/17 0950  LABPROT 12.7  INR 0.96   Hepatitis Panel Recent  Labs    06/01/17 2225  HEPBSAG Negative  HCVAB 0.3  HEPAIGM Negative  HEPBIGM Negative     Studies/Results: Mr 3d Recon At Scanner  Result Date: 06/04/2017 CLINICAL DATA:  Abdominal pain and nausea for 5 days. Jaundice. Elevated liver function tests. EXAM: MRI ABDOMEN WITHOUT AND WITH CONTRAST (INCLUDING MRCP) TECHNIQUE: Multiplanar multisequence MR imaging of the abdomen was performed both before and after the administration of intravenous contrast. Heavily T2-weighted images of the biliary and pancreatic ducts were obtained, and three-dimensional MRCP images were rendered by post processing. CONTRAST:  77m MULTIHANCE GADOBENATE DIMEGLUMINE 529 MG/ML IV SOLN COMPARISON:  CT on 06/01/2017 FINDINGS: Lower chest: No acute findings. Hepatobiliary: No hepatic masses identified. Tiny sub-cm subcapsular cyst seen in posterior right hepatic lobe. No evidence of steatosis on chemical shift imaging. Gallbladder is unremarkable. No significant biliary ductal dilatation with common bile duct measuring 6 mm. No evidence of choledocholithiasis. Pancreas: No mass or inflammatory changes. No evidence of pancreatic ductal dilatation. Spleen:  Within normal limits in size and appearance. Adrenals/Urinary Tract: No masses identified. No evidence of hydronephrosis. Stomach/Bowel: Visualized portions within the abdomen are unremarkable. Vascular/Lymphatic: No pathologically enlarged lymph nodes identified. No abdominal aortic aneurysm. Other:  None. Musculoskeletal:  No suspicious bone lesions identified. IMPRESSION: No radiographic evidence of hepatic pathology, biliary ductal dilatation, or other significant abnormality. Electronically  Signed   By: Earle Gell M.D.   On: 06/04/2017 09:19   Mr Abdomen Mrcp Moise Boring Contast  Result Date: 06/04/2017 CLINICAL DATA:  Abdominal pain and nausea for 5 days. Jaundice. Elevated liver function tests. EXAM: MRI ABDOMEN WITHOUT AND WITH CONTRAST (INCLUDING MRCP) TECHNIQUE:  Multiplanar multisequence MR imaging of the abdomen was performed both before and after the administration of intravenous contrast. Heavily T2-weighted images of the biliary and pancreatic ducts were obtained, and three-dimensional MRCP images were rendered by post processing. CONTRAST:  76m MULTIHANCE GADOBENATE DIMEGLUMINE 529 MG/ML IV SOLN COMPARISON:  CT on 06/01/2017 FINDINGS: Lower chest: No acute findings. Hepatobiliary: No hepatic masses identified. Tiny sub-cm subcapsular cyst seen in posterior right hepatic lobe. No evidence of steatosis on chemical shift imaging. Gallbladder is unremarkable. No significant biliary ductal dilatation with common bile duct measuring 6 mm. No evidence of choledocholithiasis. Pancreas: No mass or inflammatory changes. No evidence of pancreatic ductal dilatation. Spleen:  Within normal limits in size and appearance. Adrenals/Urinary Tract: No masses identified. No evidence of hydronephrosis. Stomach/Bowel: Visualized portions within the abdomen are unremarkable. Vascular/Lymphatic: No pathologically enlarged lymph nodes identified. No abdominal aortic aneurysm. Other:  None. Musculoskeletal:  No suspicious bone lesions identified. IMPRESSION: No radiographic evidence of hepatic pathology, biliary ductal dilatation, or other significant abnormality. Electronically Signed   By: JEarle GellM.D.   On: 06/04/2017 09:19    Assessment: 46year old female admitted with mixed pattern elevated LFTs, negative acute hepatitis panel, with MRCP this morning without CBD stone and pancreas normal. Bilirubin similar to yesterday, with transaminases and alk phos increasing. No mental status changes, confusion. Ordering further extensive serologies and recheck INR today.    Plan: Further extensive serologies, check INR Monitor mental status, monitor for decompensation Recheck HFP, INR in am May resume diet today   Angel Humphrey Needs PhD, ANP-BC [facility]       LOS: 2 days    06/04/2017, 10:09 AM

## 2017-06-04 NOTE — Progress Notes (Signed)
PROGRESS NOTE    Angel Humphrey  UKG:[id number] DOB: Dec 13, 1970 DOA: 06/01/2017 PCP: Yvone Neu, MD     Brief Narrative:  46 year old woman admitted  on 11/24 after being sent to the ED from urgent care due to abdominal pain, nausea and jaundice.  Admission was requested.  Assessment & Plan:   Principal Problem:   Jaundice Active Problems:   Hypertension   Interstitial cystitis   Hypokalemia   Acute pancreatitis   UTI (urinary tract infection)   Jaundice/transaminitis -LFTs continue trending up, bilirubin 7.2. -MRCP done 11/26 did not show any abnormalities.  -GI starting a metabolic workup.    Hypokalemia -repleted, recheck in AM.   Hypertension -Well-controlled, continue losartan.  DVT prophylaxis: SCDs Code Status: Full code Family Communication: husband at bedside  Disposition Plan: TBD  Consultants:   GI  Procedures:   None  Antimicrobials:  Anti-infectives (From admission, onward)   Start     Dose/Rate Route Frequency Ordered Stop   06/02/17 0800  levofloxacin (LEVAQUIN) IVPB 500 mg  Status:  Discontinued     500 mg 100 mL/hr over 60 Minutes Intravenous Every 24 hours 06/02/17 0704 06/02/17 1113       Subjective: Pt has dark colored urine and itching.   Objective: Vitals:   06/03/17 0523 06/03/17 1600 06/03/17 1932 06/04/17 0557  BP: 130/65 121/86 115/64 134/77  Pulse: 82 96 97 85  Resp: 18 18 18 20   Temp: 98.4 F (36.9 C) 99.6 F (37.6 C) 98.7 F (37.1 C) 98.2 F (36.8 C)  TempSrc:  Oral Oral Oral  SpO2: 98% 97% 97% 98%  Weight:      Height:        Intake/Output Summary (Last 24 hours) at 06/04/2017 1024 Last data filed at 06/04/2017 0100 Gross per 24 hour  Intake 240 ml  Output 2000 ml  Net -1760 ml   Filed Weights   06/01/17 2134 06/02/17 0144  Weight: 78 kg (172 lb) 77.2 kg (170 lb 3.2 oz)    Examination:  General exam: Alert, awake, oriented x 3 very jaundiced, bilateral scleral icterus. Respiratory  system: Clear to auscultation. Respiratory effort normal. Cardiovascular system:normal s1, s2.   Gastrointestinal system: Abdomen is nondistended, soft and nontender mildly tender to palpation to the right upper quadrant. No organomegaly or masses felt. Normal bowel sounds heard. Central nervous system: Alert and oriented. No focal neurological deficits. Extremities: No C/C/E, +pedal pulses Skin: No rashes, lesions or ulcers Psychiatry: Judgement and insight appear normal. Mood & affect appropriate.   Data Reviewed: I have personally reviewed following labs and imaging studies  CBC: Recent Labs  Lab 06/01/17 2225 06/02/17 0950 06/03/17 0742 06/04/17 0637  WBC 10.7* 8.5 7.9 9.5  NEUTROABS  --  6.5  --   --   HGB 12.8 12.6 12.0 12.8  HCT 39.5 39.1 38.3 40.6  MCV 87.0 87.9 89.5 88.5  PLT 311 278 327 762   Basic Metabolic Panel: Recent Labs  Lab 06/01/17 2225 06/02/17 0950 06/03/17 0742 06/04/17 0637  NA 134* 139 138 137  K 2.6* 3.4* 4.3 3.7  CL 98* 108 106 102  CO2 25 24 24 24   GLUCOSE 139* 110* 118* 144*  BUN 7 6 <5* 7  CREATININE 0.60 0.42* 0.38* 0.48  CALCIUM 9.1 9.0 9.5 9.4  MG 1.8  --   --   --   PHOS 3.6  --   --   --    GFR: Estimated Creatinine Clearance: 84.6 mL/min (by C-G  formula based on SCr of 0.48 mg/dL). Liver Function Tests: Recent Labs  Lab 06/01/17 2225 06/02/17 0950 06/03/17 0742 06/04/17 0637  AST 78* 80* 99* 120*  ALT 163* 147* 158* 176*  ALKPHOS 328* 318* 344* 384*  BILITOT 6.9* 6.4* 7.3* 7.2*  PROT 7.7 7.1 7.2 7.6  ALBUMIN 3.6 3.1* 3.3* 3.4*   Recent Labs  Lab 06/01/17 2225  LIPASE 177*   No results for input(s): AMMONIA in the last 168 hours. Coagulation Profile: Recent Labs  Lab 06/02/17 0950  INR 0.96   Cardiac Enzymes: No results for input(s): CKTOTAL, CKMB, CKMBINDEX, TROPONINI in the last 168 hours. BNP (last 3 results) No results for input(s): PROBNP in the last 8760 hours. HbA1C: No results for input(s): HGBA1C in  the last 72 hours. CBG: No results for input(s): GLUCAP in the last 168 hours. Lipid Profile: No results for input(s): CHOL, HDL, LDLCALC, TRIG, CHOLHDL, LDLDIRECT in the last 72 hours. Thyroid Function Tests: No results for input(s): TSH, T4TOTAL, FREET4, T3FREE, THYROIDAB in the last 72 hours. Anemia Panel: No results for input(s): VITAMINB12, FOLATE, FERRITIN, TIBC, IRON, RETICCTPCT in the last 72 hours. Urine analysis:    Component Value Date/Time   COLORURINE AMBER (A) 06/01/2017 2247   APPEARANCEUR CLEAR 06/01/2017 2247   LABSPEC 1.006 06/01/2017 2247   PHURINE 6.0 06/01/2017 2247   GLUCOSEU NEGATIVE 06/01/2017 2247   HGBUR SMALL (A) 06/01/2017 2247   BILIRUBINUR SMALL (A) 06/01/2017 2247   [place] 06/01/2017 2247   PROTEINUR NEGATIVE 06/01/2017 2247   NITRITE NEGATIVE 06/01/2017 2247   LEUKOCYTESUR SMALL (A) 06/01/2017 2247    Recent Results (from the past 240 hour(s))  Culture, Urine     Status: None   Collection Time: 06/02/17 12:22 PM  Result Value Ref Range Status   Specimen Description URINE, CLEAN CATCH  Final   Special Requests NONE  Final   Culture   Final    NO GROWTH Performed at [facility], [place] [address], [place], [place] [address]    Report Status 06/03/2017 FINAL  Final    Radiology Studies: Mr 3d Recon At Scanner  Result Date: 06/04/2017 CLINICAL DATA:  Abdominal pain and nausea for 5 days. Jaundice. Elevated liver function tests. EXAM: MRI ABDOMEN WITHOUT AND WITH CONTRAST (INCLUDING MRCP) TECHNIQUE: Multiplanar multisequence MR imaging of the abdomen was performed both before and after the administration of intravenous contrast. Heavily T2-weighted images of the biliary and pancreatic ducts were obtained, and three-dimensional MRCP images were rendered by post processing. CONTRAST:  85mL MULTIHANCE GADOBENATE DIMEGLUMINE 529 MG/ML IV SOLN COMPARISON:  CT on 06/01/2017 FINDINGS: Lower chest: No acute findings. Hepatobiliary: No  hepatic masses identified. Tiny sub-cm subcapsular cyst seen in posterior right hepatic lobe. No evidence of steatosis on chemical shift imaging. Gallbladder is unremarkable. No significant biliary ductal dilatation with common bile duct measuring 6 mm. No evidence of choledocholithiasis. Pancreas: No mass or inflammatory changes. No evidence of pancreatic ductal dilatation. Spleen:  Within normal limits in size and appearance. Adrenals/Urinary Tract: No masses identified. No evidence of hydronephrosis. Stomach/Bowel: Visualized portions within the abdomen are unremarkable. Vascular/Lymphatic: No pathologically enlarged lymph nodes identified. No abdominal aortic aneurysm. Other:  None. Musculoskeletal:  No suspicious bone lesions identified. IMPRESSION: No radiographic evidence of hepatic pathology, biliary ductal dilatation, or other significant abnormality. Electronically Signed   By: Earle Gell M.D.   On: 06/04/2017 09:19   Mr Abdomen Mrcp Moise Boring Contast  Result Date: 06/04/2017 CLINICAL DATA:  Abdominal  pain and nausea for 5 days. Jaundice. Elevated liver function tests. EXAM: MRI ABDOMEN WITHOUT AND WITH CONTRAST (INCLUDING MRCP) TECHNIQUE: Multiplanar multisequence MR imaging of the abdomen was performed both before and after the administration of intravenous contrast. Heavily T2-weighted images of the biliary and pancreatic ducts were obtained, and three-dimensional MRCP images were rendered by post processing. CONTRAST:  69mL MULTIHANCE GADOBENATE DIMEGLUMINE 529 MG/ML IV SOLN COMPARISON:  CT on 06/01/2017 FINDINGS: Lower chest: No acute findings. Hepatobiliary: No hepatic masses identified. Tiny sub-cm subcapsular cyst seen in posterior right hepatic lobe. No evidence of steatosis on chemical shift imaging. Gallbladder is unremarkable. No significant biliary ductal dilatation with common bile duct measuring 6 mm. No evidence of choledocholithiasis. Pancreas: No mass or inflammatory changes. No evidence  of pancreatic ductal dilatation. Spleen:  Within normal limits in size and appearance. Adrenals/Urinary Tract: No masses identified. No evidence of hydronephrosis. Stomach/Bowel: Visualized portions within the abdomen are unremarkable. Vascular/Lymphatic: No pathologically enlarged lymph nodes identified. No abdominal aortic aneurysm. Other:  None. Musculoskeletal:  No suspicious bone lesions identified. IMPRESSION: No radiographic evidence of hepatic pathology, biliary ductal dilatation, or other significant abnormality. Electronically Signed   By: Earle Gell M.D.   On: 06/04/2017 09:19   Scheduled Meds: . losartan  50 mg Oral Daily   And  . hydrochlorothiazide  12.5 mg Oral Daily  . pantoprazole  40 mg Oral Daily   Continuous Infusions: . 0.9 % NaCl with KCl 20 mEq / L 50 mL/hr at 06/03/17 1216     LOS: 2 days   Time spent: 35 minutes. Greater than 50% of this time was spent in direct contact with the patient coordinating care.   Irwin Brakeman, MD [facility] Pager [phone]  If 7PM-7AM, please contact night-coverage [email or web address] Password [facility] 06/04/2017, 10:24 AM

## 2017-06-05 ENCOUNTER — Encounter: Payer: Self-pay | Admitting: Gastroenterology

## 2017-06-05 ENCOUNTER — Telehealth: Payer: Self-pay | Admitting: Gastroenterology

## 2017-06-05 ENCOUNTER — Other Ambulatory Visit: Payer: Self-pay

## 2017-06-05 DIAGNOSIS — K859 Acute pancreatitis without necrosis or infection, unspecified: Secondary | ICD-10-CM

## 2017-06-05 DIAGNOSIS — N3010 Interstitial cystitis (chronic) without hematuria: Secondary | ICD-10-CM

## 2017-06-05 LAB — IGG, IGA, IGM
IgA: 335 mg/dL (ref 87–352)
IgG (Immunoglobin G), Serum: 1003 mg/dL (ref 700–1600)
IgM (Immunoglobulin M), Srm: 121 mg/dL (ref 26–217)

## 2017-06-05 LAB — CBC WITH DIFFERENTIAL/PLATELET
Basophils Absolute: 0.1 10*3/uL (ref 0.0–0.1)
Basophils Relative: 1 %
EOS ABS: 0.6 10*3/uL (ref 0.0–0.7)
Eosinophils Relative: 7 %
HEMATOCRIT: 40.5 % (ref 36.0–46.0)
HEMOGLOBIN: 12.7 g/dL (ref 12.0–15.0)
LYMPHS ABS: 1.5 10*3/uL (ref 0.7–4.0)
Lymphocytes Relative: 17 %
MCH: 28 pg (ref 26.0–34.0)
MCHC: 31.4 g/dL (ref 30.0–36.0)
MCV: 89.2 fL (ref 78.0–100.0)
MONOS PCT: 10 %
Monocytes Absolute: 0.9 10*3/uL (ref 0.1–1.0)
NEUTROS ABS: 5.8 10*3/uL (ref 1.7–7.7)
Neutrophils Relative %: 65 %
Platelets: 384 10*3/uL (ref 150–400)
RBC: 4.54 MIL/uL (ref 3.87–5.11)
RDW: 13.9 % (ref 11.5–15.5)
WBC: 8.9 10*3/uL (ref 4.0–10.5)

## 2017-06-05 LAB — COMPREHENSIVE METABOLIC PANEL
ALT: 191 U/L — ABNORMAL HIGH (ref 14–54)
ANION GAP: 9 (ref 5–15)
AST: 136 U/L — ABNORMAL HIGH (ref 15–41)
Albumin: 3.4 g/dL — ABNORMAL LOW (ref 3.5–5.0)
Alkaline Phosphatase: 413 U/L — ABNORMAL HIGH (ref 38–126)
BILIRUBIN TOTAL: 7 mg/dL — AB (ref 0.3–1.2)
BUN: 8 mg/dL (ref 6–20)
CO2: 26 mmol/L (ref 22–32)
Calcium: 9.7 mg/dL (ref 8.9–10.3)
Chloride: 102 mmol/L (ref 101–111)
Creatinine, Ser: 0.54 mg/dL (ref 0.44–1.00)
GFR calc Af Amer: >60 mL/min (ref >60)
Glucose, Bld: 150 mg/dL — ABNORMAL HIGH (ref 65–99)
POTASSIUM: 3.9 mmol/L (ref 3.5–5.1)
Sodium: 137 mmol/L (ref 135–145)
TOTAL PROTEIN: 7.6 g/dL (ref 6.5–8.1)

## 2017-06-05 LAB — ANTI-SMOOTH MUSCLE ANTIBODY, IGG: F-ACTIN AB IGG: 11 U (ref 0–19)

## 2017-06-05 LAB — PROTIME-INR
INR: 0.9
Prothrombin Time: 12.1 seconds (ref 11.4–15.2)

## 2017-06-05 LAB — ALPHA-1 ANTITRYPSIN PHENOTYPE: A-1 Antitrypsin, Ser: 236 mg/dL — ABNORMAL HIGH (ref 90–200)

## 2017-06-05 LAB — ANTINUCLEAR ANTIBODIES, IFA: ANTINUCLEAR ANTIBODIES, IFA: NEGATIVE

## 2017-06-05 LAB — CERULOPLASMIN: CERULOPLASMIN: 45 mg/dL — AB (ref 19.0–39.0)

## 2017-06-05 LAB — MITOCHONDRIAL ANTIBODIES: MITOCHONDRIAL M2 AB, IGG: 4.5 U (ref 0.0–20.0)

## 2017-06-05 MED ORDER — DIPHENHYDRAMINE HCL 25 MG PO CAPS: 25.0000 mg | ORAL_CAPSULE | Freq: Four times a day (QID) | ORAL | 0 refills | Status: AC | PRN

## 2017-06-05 MED ORDER — DIPHENHYDRAMINE HCL 25 MG PO CAPS: 25.0000 mg | ORAL_CAPSULE | Freq: Four times a day (QID) | ORAL | Status: DC | PRN

## 2017-06-05 MED ORDER — ONDANSETRON HCL 4 MG PO TABS
4.0000 mg | ORAL_TABLET | Freq: Three times a day (TID) | ORAL | 0 refills | Status: DC | PRN
Start: 2017-06-05 — End: 2017-07-06

## 2017-06-05 NOTE — Discharge Instructions (Signed)
Follow with Primary MD  Hungarland, Jenetta Downer, MD  and other consultant's as instructed your Hospitalist MD  Please get a complete blood count and chemistry panel checked by your Primary MD at your next visit, and again as instructed by your Primary MD.  Get Medicines reviewed and adjusted: Please take all your medications with you for your next visit with your Primary MD  Laboratory/radiological data: Please request your Primary MD to go over all hospital tests and procedure/radiological results at the follow up, please ask your Primary MD to get all Hospital records sent to his/her office.  In some cases, they will be blood work, cultures and biopsy results pending at the time of your discharge. Please request that your primary care M.D. follows up on these results.  Also Note the following: If you experience worsening of your admission symptoms, develop shortness of breath, life threatening emergency, suicidal or homicidal thoughts you must seek medical attention immediately by calling 911 or calling your MD immediately  if symptoms less severe.  You must read complete instructions/literature along with all the possible adverse reactions/side effects for all the Medicines you take and that have been prescribed to you. Take any new Medicines after you have completely understood and accpet all the possible adverse reactions/side effects.   Do not drive when taking Pain medications or sleeping medications (Benzodaizepines)  Do not take more than prescribed Pain, Sleep and Anxiety Medications. It is not advisable to combine anxiety,sleep and pain medications without talking with your primary care practitioner  Special Instructions: If you have smoked or chewed Tobacco  in the last 2 yrs please stop smoking, stop any regular Alcohol  and or any Recreational drug use.  Wear Seat belts while driving.  Please note: You were cared for by a hospitalist during your hospital stay. Once you are  discharged, your primary care physician will handle any further medical issues. Please note that NO REFILLS for any discharge medications will be authorized once you are discharged, as it is imperative that you return to your primary care physician (or establish a relationship with a primary care physician if you do not have one) for your post hospital discharge needs so that they can reassess your need for medications and monitor your lab values.

## 2017-06-05 NOTE — Telephone Encounter (Signed)
Patient will likely be discharged today, recent admission for elevated LFTs, jaundice. Needs HFP and INR on Thursday. Patient aware we will be contacting her with directions to [facility] and reminder for labs.  Please arrange close outpatient follow-up in the next 2 weeks.

## 2017-06-05 NOTE — Progress Notes (Signed)
REVIEWED-NO ADDITIONAL RECOMMENDATIONS.   Subjective: No abdominal pain. Had a good night and rested well. No nausea. Tolerating diet. No confusion or mental status changes.   Objective: Vital signs in last 24 hours: Temp:  [98.1 F (36.7 C)-98.7 F (37.1 C)] 98.1 F (36.7 C) (11/27 0512) Pulse Rate:  [78-93] 78 (11/27 0512) Resp:  [16-18] 16 (11/27 0512) BP: (114-137)/(61-82) 137/61 (11/27 0512) SpO2:  [97 %-98 %] 98 % (11/27 0512) Last BM Date: 06/03/17 General:   Alert and oriented, pleasant, jaundiced Head:  Normocephalic and atraumatic. Eyes:  +scleral icterus  Mouth:  Without lesions Abdomen:  Bowel sounds present, soft, mild TTP RUQ, non-distended. No HSM or hernias noted. No rebound or guarding. No masses appreciated  Msk:  Symmetrical without gross deformities. Normal posture. Extremities:  Without  edema. Neurologic:  Alert and  oriented x4 Psych:  Alert and cooperative. Normal mood and affect.  Intake/Output from previous day: 11/26 0701 - 11/27 0700 In: 6062.5 [P.O.:960; I.V.:5102.5] Out: 2900 [Urine:2900] Intake/Output this shift: No intake/output data recorded.  Lab Results: Recent Labs    06/03/17 0742 06/04/17 0637 06/05/17 0651  WBC 7.9 9.5 8.9  HGB 12.0 12.8 12.7  HCT 38.3 40.6 40.5  PLT 327 362 384   BMET Recent Labs    06/02/17 0950 06/03/17 0742 06/04/17 0637  NA 139 138 137  K 3.4* 4.3 3.7  CL 108 106 102  CO2 24 24 24   GLUCOSE 110* 118* 144*  BUN 6 <5* 7  CREATININE 0.42* 0.38* 0.48  CALCIUM 9.0 9.5 9.4   LFT Recent Labs    06/02/17 0950 06/03/17 0742 06/04/17 0637  PROT 7.1 7.2 7.6  ALBUMIN 3.1* 3.3* 3.4*  AST 80* 99* 120*  ALT 147* 158* 176*  ALKPHOS 318* 344* 384*  BILITOT 6.4* 7.3* 7.2*  BILIDIR 4.0*  --   --   IBILI 2.4*  --   --    PT/INR Recent Labs    06/04/17 1202 06/05/17 0651  LABPROT 12.4 12.1  INR 0.93 0.90     Studies/Results: Mr 3d Recon At Scanner  Result Date: 06/04/2017 CLINICAL DATA:   Abdominal pain and nausea for 5 days. Jaundice. Elevated liver function tests. EXAM: MRI ABDOMEN WITHOUT AND WITH CONTRAST (INCLUDING MRCP) TECHNIQUE: Multiplanar multisequence MR imaging of the abdomen was performed both before and after the administration of intravenous contrast. Heavily T2-weighted images of the biliary and pancreatic ducts were obtained, and three-dimensional MRCP images were rendered by post processing. CONTRAST:  19m MULTIHANCE GADOBENATE DIMEGLUMINE 529 MG/ML IV SOLN COMPARISON:  CT on 06/01/2017 FINDINGS: Lower chest: No acute findings. Hepatobiliary: No hepatic masses identified. Tiny sub-cm subcapsular cyst seen in posterior right hepatic lobe. No evidence of steatosis on chemical shift imaging. Gallbladder is unremarkable. No significant biliary ductal dilatation with common bile duct measuring 6 mm. No evidence of choledocholithiasis. Pancreas: No mass or inflammatory changes. No evidence of pancreatic ductal dilatation. Spleen:  Within normal limits in size and appearance. Adrenals/Urinary Tract: No masses identified. No evidence of hydronephrosis. Stomach/Bowel: Visualized portions within the abdomen are unremarkable. Vascular/Lymphatic: No pathologically enlarged lymph nodes identified. No abdominal aortic aneurysm. Other:  None. Musculoskeletal:  No suspicious bone lesions identified. IMPRESSION: No radiographic evidence of hepatic pathology, biliary ductal dilatation, or other significant abnormality. Electronically Signed   By: JEarle GellM.D.   On: 06/04/2017 09:19   Mr Abdomen Mrcp WMoise BoringContast  Result Date: 06/04/2017 CLINICAL DATA:  Abdominal pain and nausea for 5 days. Jaundice.  Elevated liver function tests. EXAM: MRI ABDOMEN WITHOUT AND WITH CONTRAST (INCLUDING MRCP) TECHNIQUE: Multiplanar multisequence MR imaging of the abdomen was performed both before and after the administration of intravenous contrast. Heavily T2-weighted images of the biliary and pancreatic  ducts were obtained, and three-dimensional MRCP images were rendered by post processing. CONTRAST:  23m MULTIHANCE GADOBENATE DIMEGLUMINE 529 MG/ML IV SOLN COMPARISON:  CT on 06/01/2017 FINDINGS: Lower chest: No acute findings. Hepatobiliary: No hepatic masses identified. Tiny sub-cm subcapsular cyst seen in posterior right hepatic lobe. No evidence of steatosis on chemical shift imaging. Gallbladder is unremarkable. No significant biliary ductal dilatation with common bile duct measuring 6 mm. No evidence of choledocholithiasis. Pancreas: No mass or inflammatory changes. No evidence of pancreatic ductal dilatation. Spleen:  Within normal limits in size and appearance. Adrenals/Urinary Tract: No masses identified. No evidence of hydronephrosis. Stomach/Bowel: Visualized portions within the abdomen are unremarkable. Vascular/Lymphatic: No pathologically enlarged lymph nodes identified. No abdominal aortic aneurysm. Other:  None. Musculoskeletal:  No suspicious bone lesions identified. IMPRESSION: No radiographic evidence of hepatic pathology, biliary ductal dilatation, or other significant abnormality. Electronically Signed   By: JEarle GellM.D.   On: 06/04/2017 09:19    Assessment: 46year old female admitted with mixed pattern elevated LFTs, negative acute hepatitis panel, no evidence of CBD stones on MRCP. No mental status changes, confusion. Tolerating diet.   Elevated LFTs; bilirubin slightly improved from yesterday. Alk phos and transaminases increased. Thus far, INR remains normal, iron studies normal, ceruloplasmin elevated and not decreased so unlikely Wilson's disease. Alpha-1 antitrypsin, ANA, ASMA, and AMA remain in process. Immunoglobulins are normal.   Plan: Will follow-up on pending serologies May be discharged with close outpatient follow-up and repeat of HFP, INR later this week: will need to follow as outpatient Will arrange outpatient follow-up  Monitor for signs of confusion, mental  status changes Letter provided for out of work for 2 weeks Discussed with patient FMLA paperwork and completion fee through office   AAnnitta Needs PhD, ANP-BC [facility]     LOS: 3 days    06/05/2017, 8:01 AM

## 2017-06-05 NOTE — Progress Notes (Signed)
Discharge instructions given, verbalized understanding, out in stable condition ambulatory with staff. 

## 2017-06-05 NOTE — Progress Notes (Signed)
PHARMACIST - PHYSICIAN COMMUNICATION  DR:   TRH CONCERNING: IV to Oral Route Change Policy  RECOMMENDATION: This patient is receiving diphenhydramine by the intravenous route.  Based on criteria approved by the Pharmacy and Therapeutics Committee, intravenous diphenhydramine is being converted to the equivalent oral dose form(s).   DESCRIPTION: These criteria include:  Diphenhydramine is not prescribed to treat or prevent a severe allergic reaction  Diphenhydramine is not prescribed as premedication prior to receiving blood product, biologic medication, antimicrobial, or chemotherapy agent  The patient has tolerated at least one dose of an oral or enteral medication  The patient has no evidence of active gastrointestinal bleeding or impaired GI absorption (gastrectomy, short bowel, patient on TNA or NPO).  The patient is not undergoing procedural sedation   If you have questions about this conversion, please contact the Pharmacy Department  [x]   [phone] )  [facility] []   [phone] )  [facility] []   [phone] )  [facility] []   [phone] )  [facility] []   [phone] )  [facility]  Pricilla Larsson, [facility] 06/05/2017 11:09 AM

## 2017-06-05 NOTE — Care Management Note (Signed)
Case Management Note  Patient Details  Name: Angel Humphrey MRN: [id number] Date of Birth: 08-Mar-1971  Subjective/Objective:      Admitted with  Jaundice and transaminitis. Chart reviewed for DC planning needs. Pt is from home, lives with spouse, she is ind with ALD's, employed, has PCP and transportation. She has insurance with drug coverage. She will f/u with GI and have labs drawn on Thursday (being arranged by GI).            Action/Plan: DC home today with self care. No CM needs noted at this time.   Expected Discharge Date:    06/05/2017              Expected Discharge Plan:  Home/Self Care  In-House Referral:  NA  Discharge planning Services  NA  Post Acute Care Choice:  NA Choice offered to:  NA  Status of Service:  Completed, signed off  Sherald Barge, RN 06/05/2017, 10:13 AM

## 2017-06-05 NOTE — Telephone Encounter (Signed)
PATIENT SCHEDULED  °

## 2017-06-05 NOTE — Discharge Summary (Signed)
Physician Discharge Summary  Angel Humphrey HCW:[id number] DOB: 04/05/1971 DOA: 06/01/2017  PCP: Yvone Neu, MD  Admit date: 06/01/2017 Discharge date: 06/05/2017  Admitted From: Home  Disposition: Home  Recommendations for Outpatient Follow-up:  1. Follow up with PCP in 1 weeks 2. Follow up with GI in 2 weeks.  3. Please obtain CMP and INR in 2 days as scheduled with GI.  4. Please follow up on the following pending results: liver serology testing  Discharge Condition: STABLE   CODE STATUS: FULL    Brief Hospitalization Summary: Please see all hospital notes, images, labs for full details of the hospitalization. HPI: Angel Humphrey is a 46 y.o. female with medical history significant of diverticulosis, hypertension, interstitial cystitis who is coming to the emergency department referred from [facility] urgent care in [place], [place] after presenting there with complaints of 5 days of right upper abdominal quadrant pain associated with nausea, mild malaise, decreased appetite, and jaundice that she noticed today.  She denies fever, chills, sore throat, chest pain, dizziness, palpitations, diaphoresis, PND orthopnea.  She gets occasional lower extremity edema.  Denies melena, hematochezia or constipation.  She gets occasional frequency, but currently no new symptoms.  ED Course: Initial vital signs temperature 36.9C, pulse 81, respirations 18, blood pressure 129/80 and O2 sat 99% on room air.  She received potassium supplementation in the emergency department.  Her workup shows urinalysis with amber colored, small hemoglobinuria, small bilirubin, small leukocyte esterase.  Microscopic shows few bacteria with 6-30 WBC.  WBC of 10.7, hemoglobin 12.8 g/dL and platelets 311.  Her sodium was 134, potassium 2.6, CO2 25 and chloride 98 mmol/L.  AST was 78, ALT 163, alkaline phosphatase 328 and lipase 177.  Her total bilirubin was 6.9 and direct bilirubin was 4.2.  Acetaminophen, alcohol,  magnesium, phosphorus and lactic acid levels were normal.  Imaging: CT abdomen/pelvis with contrast show probable uterine fibroids and small periumbilical hernia containing fat, but no abnormality that could account for jaundice. Please see images and full radiology report for further detail.  Jaundice/transaminitis -LFTs continue trending up, bilirubin 7.0. -Clinically much improved, tolerating diet, GI says ok to discharge home with outpatient follow up.  They have arranged repeat labs in 2 days for CMP and INR testing.   They will follow up on serologies with her and want to see her in 2 weeks.  Pt was given this information and verbalized understanding.   -MRCP done 11/26 did not show any abnormalities.  -GI starting a metabolic workup and serologies still pending at discharge..    Hypokalemia -repleted, recheck in 2 days as arranged.    Hypertension -Well-controlled, continue losartan.  DVT prophylaxis: SCDs Code Status: Full code Family Communication: husband at bedside   Discharge Diagnoses:  Principal Problem:   Jaundice Active Problems:   Hypertension   Interstitial cystitis   Hypokalemia   Acute pancreatitis   UTI (urinary tract infection)   Transaminitis  Discharge Instructions: Discharge Instructions    Call MD for:  difficulty breathing, headache or visual disturbances   Complete by:  As directed    Call MD for:  extreme fatigue   Complete by:  As directed    Call MD for:  persistant dizziness or light-headedness   Complete by:  As directed    Call MD for:  persistant nausea and vomiting   Complete by:  As directed    Call MD for:  severe uncontrolled pain   Complete by:  As directed    Increase  activity slowly   Complete by:  As directed      Allergies as of 06/05/2017   No Known Allergies     Medication List    STOP taking these medications   hydrochlorothiazide 12.5 MG capsule Commonly known as:  MICROZIDE     TAKE these medications    diphenhydrAMINE 25 mg capsule Commonly known as:  BENADRYL Take 1 capsule (25 mg total) by mouth every 6 (six) hours as needed for itching or sleep.   losartan-hydrochlorothiazide 50-12.5 MG tablet Commonly known as:  HYZAAR Take 1 tablet by mouth daily.   norethindrone 0.35 MG tablet Commonly known as:  MICRONOR,CAMILA,ERRIN Take 1 tablet by mouth daily.   omeprazole 40 MG capsule Commonly known as:  PRILOSEC Take 40 mg by mouth daily.   ondansetron 4 MG tablet Commonly known as:  ZOFRAN Take 1 tablet (4 mg total) by mouth every 8 (eight) hours as needed for nausea or vomiting.      Follow-up Information    [place]ar[place], Jenetta Downer, MD Follow up in 1 week(s).   Specialty:  Family Medicine Contact information: [facility] [address] [place] [address] [phone]        Danie Binder, MD. Schedule an appointment as soon as possible for a visit in 2 week(s).   Specialty:  Gastroenterology Contact information: [address] [place] [address] [phone]          No Known Allergies Current Discharge Medication List    START taking these medications   Details  diphenhydrAMINE (BENADRYL) 25 mg capsule Take 1 capsule (25 mg total) by mouth every 6 (six) hours as needed for itching or sleep. Qty: 30 capsule, Refills: 0    ondansetron (ZOFRAN) 4 MG tablet Take 1 tablet (4 mg total) by mouth every 8 (eight) hours as needed for nausea or vomiting. Qty: 10 tablet, Refills: 0      CONTINUE these medications which have NOT CHANGED   Details  losartan-hydrochlorothiazide (HYZAAR) 50-12.5 MG tablet Take 1 tablet by mouth daily.    norethindrone (MICRONOR,CAMILA,ERRIN) 0.35 MG tablet Take 1 tablet by mouth daily.    omeprazole (PRILOSEC) 40 MG capsule Take 40 mg by mouth daily.      STOP taking these medications     hydrochlorothiazide (MICROZIDE) 12.5 MG capsule         Procedures/Studies: Ct Abdomen Pelvis W  Contrast  Result Date: 06/02/2017 CLINICAL DATA:  Upper abdominal pain and nausea for 5 days. Jaundice. EXAM: CT ABDOMEN AND PELVIS WITH CONTRAST TECHNIQUE: Multidetector CT imaging of the abdomen and pelvis was performed using the standard protocol following bolus administration of intravenous contrast. CONTRAST:  140mL ISOVUE-300 IOPAMIDOL (ISOVUE-300) INJECTION 61% COMPARISON:  None. FINDINGS: Lower chest: Lung bases are clear. Hepatobiliary: No focal liver abnormality is seen. No gallstones, gallbladder wall thickening, or biliary dilatation. Pancreas: Unremarkable. No pancreatic ductal dilatation or surrounding inflammatory changes. Spleen: Normal in size without focal abnormality. Adrenals/Urinary Tract: Adrenal glands are unremarkable. Kidneys are normal, without renal calculi, focal lesion, or hydronephrosis. Bladder is unremarkable. Stomach/Bowel: Stomach is within normal limits. Appendix is not identified. No evidence of bowel wall thickening, distention, or inflammatory changes. Scattered colonic diverticula without evidence of diverticulitis. Vascular/Lymphatic: No significant vascular findings are present. No enlarged abdominal or pelvic lymph nodes. Reproductive: Heterogeneous nodular appearance to the uterus probably representing small fibroids. Uterus and ovaries are not enlarged. Other: Small periumbilical hernia containing fat. No free air or free fluid in  the abdomen. Musculoskeletal: No acute or significant osseous findings. IMPRESSION: 1. No abnormality identified to account for history jaundice. 2. Probable uterine fibroids. 3. Small periumbilical hernia containing fat. Electronically Signed   By: Lucienne Capers M.D.   On: 06/02/2017 00:09   Mr 3d Recon At Scanner  Result Date: 06/04/2017 CLINICAL DATA:  Abdominal pain and nausea for 5 days. Jaundice. Elevated liver function tests. EXAM: MRI ABDOMEN WITHOUT AND WITH CONTRAST (INCLUDING MRCP) TECHNIQUE: Multiplanar multisequence MR  imaging of the abdomen was performed both before and after the administration of intravenous contrast. Heavily T2-weighted images of the biliary and pancreatic ducts were obtained, and three-dimensional MRCP images were rendered by post processing. CONTRAST:  60mL MULTIHANCE GADOBENATE DIMEGLUMINE 529 MG/ML IV SOLN COMPARISON:  CT on 06/01/2017 FINDINGS: Lower chest: No acute findings. Hepatobiliary: No hepatic masses identified. Tiny sub-cm subcapsular cyst seen in posterior right hepatic lobe. No evidence of steatosis on chemical shift imaging. Gallbladder is unremarkable. No significant biliary ductal dilatation with common bile duct measuring 6 mm. No evidence of choledocholithiasis. Pancreas: No mass or inflammatory changes. No evidence of pancreatic ductal dilatation. Spleen:  Within normal limits in size and appearance. Adrenals/Urinary Tract: No masses identified. No evidence of hydronephrosis. Stomach/Bowel: Visualized portions within the abdomen are unremarkable. Vascular/Lymphatic: No pathologically enlarged lymph nodes identified. No abdominal aortic aneurysm. Other:  None. Musculoskeletal:  No suspicious bone lesions identified. IMPRESSION: No radiographic evidence of hepatic pathology, biliary ductal dilatation, or other significant abnormality. Electronically Signed   By: Earle Gell M.D.   On: 06/04/2017 09:19   Mr Abdomen Mrcp Moise Boring Contast  Result Date: 06/04/2017 CLINICAL DATA:  Abdominal pain and nausea for 5 days. Jaundice. Elevated liver function tests. EXAM: MRI ABDOMEN WITHOUT AND WITH CONTRAST (INCLUDING MRCP) TECHNIQUE: Multiplanar multisequence MR imaging of the abdomen was performed both before and after the administration of intravenous contrast. Heavily T2-weighted images of the biliary and pancreatic ducts were obtained, and three-dimensional MRCP images were rendered by post processing. CONTRAST:  51mL MULTIHANCE GADOBENATE DIMEGLUMINE 529 MG/ML IV SOLN COMPARISON:  CT on  06/01/2017 FINDINGS: Lower chest: No acute findings. Hepatobiliary: No hepatic masses identified. Tiny sub-cm subcapsular cyst seen in posterior right hepatic lobe. No evidence of steatosis on chemical shift imaging. Gallbladder is unremarkable. No significant biliary ductal dilatation with common bile duct measuring 6 mm. No evidence of choledocholithiasis. Pancreas: No mass or inflammatory changes. No evidence of pancreatic ductal dilatation. Spleen:  Within normal limits in size and appearance. Adrenals/Urinary Tract: No masses identified. No evidence of hydronephrosis. Stomach/Bowel: Visualized portions within the abdomen are unremarkable. Vascular/Lymphatic: No pathologically enlarged lymph nodes identified. No abdominal aortic aneurysm. Other:  None. Musculoskeletal:  No suspicious bone lesions identified. IMPRESSION: No radiographic evidence of hepatic pathology, biliary ductal dilatation, or other significant abnormality. Electronically Signed   By: Earle Gell M.D.   On: 06/04/2017 09:19   US Abdomen Limited Ruq  Result Date: 06/02/2017 CLINICAL DATA:  Acute hepatitis. Evaluate for gallstone. Upper back and epigastric pain. EXAM: ULTRASOUND ABDOMEN LIMITED RIGHT UPPER QUADRANT COMPARISON:  Abdominal CT 06/01/2017. FINDINGS: Gallbladder: No gallstones or wall thickening visualized. No sonographic Murphy sign noted by sonographer. Common bile duct: Diameter: 0.7 cm. Review of the recent CT demonstrates a small focus of gas in the common bile duct. Findings raise concern for a small CBD stone at this location. Liver: No focal lesion identified. Within normal limits in parenchymal echogenicity. Portal vein is patent on color Doppler imaging with normal direction of  blood flow towards the liver. IMPRESSION: Common bile duct is mildly dilated. Review of recent CT raises concern for a CBD stone. Consider further characterization with MRCP. No gallstones. Electronically Signed   By: Markus Daft M.D.   On:  06/02/2017 12:17      Subjective: Pt says she is feeling much better today, tolerating diet well.  Wants to go home.   Discharge Exam: Vitals:   06/04/17 2055 06/05/17 0512  BP: 117/82 137/61  Pulse: 86 78  Resp: 18 16  Temp: 98.2 F (36.8 C) 98.1 F (36.7 C)  SpO2: 98% 98%   Vitals:   06/04/17 0557 06/04/17 1500 06/04/17 2055 06/05/17 0512  BP: 134/77 114/71 117/82 137/61  Pulse: 85 93 86 78  Resp: 20 18 18 16   Temp: 98.2 F (36.8 C) 98.7 F (37.1 C) 98.2 F (36.8 C) 98.1 F (36.7 C)  TempSrc: Oral Oral Oral Oral  SpO2: 98% 97% 98% 98%  Weight:      Height:       General exam: Alert, awake, oriented x 3 very jaundiced, bilateral scleral icterus. Respiratory system: Clear to auscultation. Respiratory effort normal. Cardiovascular system:normal s1, s2.   Gastrointestinal system: Abdomen is nondistended, soft and nontender mildly tender to palpation to the right upper quadrant. No organomegaly or masses felt. Normal bowel sounds heard. Central nervous system: Alert and oriented. No focal neurological deficits. Extremities: No C/C/E, +pedal pulses Skin: No rashes, lesions or ulcers Psychiatry: Judgement and insight appear normal.   The results of significant diagnostics from this hospitalization (including imaging, microbiology, ancillary and laboratory) are listed below for reference.     Microbiology: Recent Results (from the past 240 hour(s))  Culture, Urine     Status: None   Collection Time: 06/02/17 12:22 PM  Result Value Ref Range Status   Specimen Description URINE, CLEAN CATCH  Final   Special Requests NONE  Final   Culture   Final    NO GROWTH Performed at [facility], [address], [place], [place] [address]    Report Status 06/03/2017 FINAL  Final     Labs: BNP (last 3 results) No results for input(s): BNP in the last 8760 hours. Basic Metabolic Panel: Recent Labs  Lab 06/01/17 2225 06/02/17 0950 06/03/17 0742 06/04/17 0637  06/05/17 0651  NA 134* 139 138 137 137  K 2.6* 3.4* 4.3 3.7 3.9  CL 98* 108 106 102 102  CO2 25 24 24 24 26   GLUCOSE 139* 110* 118* 144* 150*  BUN 7 6 <5* 7 8  CREATININE 0.60 0.42* 0.38* 0.48 0.54  CALCIUM 9.1 9.0 9.5 9.4 9.7  MG 1.8  --   --   --   --   PHOS 3.6  --   --   --   --    Liver Function Tests: Recent Labs  Lab 06/01/17 2225 06/02/17 0950 06/03/17 0742 06/04/17 0637 06/05/17 0651  AST 78* 80* 99* 120* 136*  ALT 163* 147* 158* 176* 191*  ALKPHOS 328* 318* 344* 384* 413*  BILITOT 6.9* 6.4* 7.3* 7.2* 7.0*  PROT 7.7 7.1 7.2 7.6 7.6  ALBUMIN 3.6 3.1* 3.3* 3.4* 3.4*   Recent Labs  Lab 06/01/17 2225 06/04/17 1202  LIPASE 177* 87*   No results for input(s): AMMONIA in the last 168 hours. CBC: Recent Labs  Lab 06/01/17 2225 06/02/17 0950 06/03/17 0742 06/04/17 0637 06/05/17 0651  WBC 10.7* 8.5 7.9 9.5 8.9  NEUTROABS  --  6.5  --   --  5.8  HGB 12.8 12.6 12.0 12.8 12.7  HCT 39.5 39.1 38.3 40.6 40.5  MCV 87.0 87.9 89.5 88.5 89.2  PLT 311 278 327 362 384   Cardiac Enzymes: No results for input(s): CKTOTAL, CKMB, CKMBINDEX, TROPONINI in the last 168 hours. BNP: Invalid input(s): POCBNP CBG: No results for input(s): GLUCAP in the last 168 hours. D-Dimer No results for input(s): DDIMER in the last 72 hours. Hgb A1c No results for input(s): HGBA1C in the last 72 hours. Lipid Profile No results for input(s): CHOL, HDL, LDLCALC, TRIG, CHOLHDL, LDLDIRECT in the last 72 hours. Thyroid function studies No results for input(s): TSH, T4TOTAL, T3FREE, THYROIDAB in the last 72 hours.  Invalid input(s): FREET3 Anemia work up Recent Labs    06/04/17 1202  FERRITIN 250  TIBC 447  IRON 79   Urinalysis    Component Value Date/Time   COLORURINE AMBER (A) 06/01/2017 2247   APPEARANCEUR CLEAR 06/01/2017 2247   LABSPEC 1.006 06/01/2017 2247   PHURINE 6.0 06/01/2017 2247   GLUCOSEU NEGATIVE 06/01/2017 2247   HGBUR SMALL (A) 06/01/2017 2247   BILIRUBINUR  SMALL (A) 06/01/2017 2247   [place] NEGATIVE 06/01/2017 2247   PROTEINUR NEGATIVE 06/01/2017 2247   NITRITE NEGATIVE 06/01/2017 2247   LEUKOCYTESUR SMALL (A) 06/01/2017 2247   Sepsis Labs Invalid input(s): PROCALCITONIN,  WBC,  LACTICIDVEN Microbiology Recent Results (from the past 240 hour(s))  Culture, Urine     Status: None   Collection Time: 06/02/17 12:22 PM  Result Value Ref Range Status   Specimen Description URINE, CLEAN CATCH  Final   Special Requests NONE  Final   Culture   Final    NO GROWTH Performed at [facility], [place] [address] [place], [place] [address]    Report Status 06/03/2017 FINAL  Final    Time coordinating discharge: 34 minutes   SIGNED:  Irwin Brakeman, MD  [facility] 06/05/2017, 12:23 PM Pager [phone]  If 7PM-7AM, please contact night-coverage [email or web address] Password [email or web address]

## 2017-06-05 NOTE — Telephone Encounter (Signed)
LMOM for pt to go to lab across from [facility] on Thursday and to call if questions.

## 2017-06-08 ENCOUNTER — Other Ambulatory Visit: Payer: Self-pay

## 2017-06-08 DIAGNOSIS — R945 Abnormal results of liver function studies: Secondary | ICD-10-CM

## 2017-06-08 DIAGNOSIS — R17 Unspecified jaundice: Secondary | ICD-10-CM

## 2017-06-08 DIAGNOSIS — R7989 Other specified abnormal findings of blood chemistry: Secondary | ICD-10-CM

## 2017-06-08 LAB — HEPATIC FUNCTION PANEL
AG RATIO: 1.2 (calc) (ref 1.0–2.5)
ALT: 202 U/L — AB (ref 6–29)
AST: 119 U/L — ABNORMAL HIGH (ref 10–35)
Albumin: 4 g/dL (ref 3.6–5.1)
Alkaline phosphatase (APISO): 459 U/L — ABNORMAL HIGH (ref 33–115)
BILIRUBIN DIRECT: 2.3 mg/dL — AB (ref 0.0–0.2)
BILIRUBIN TOTAL: 4.7 mg/dL — AB (ref 0.2–1.2)
Globulin: 3.3 g/dL (calc) (ref 1.9–3.7)
Indirect Bilirubin: 2.4 mg/dL (calc) — ABNORMAL HIGH (ref 0.2–1.2)
Total Protein: 7.3 g/dL (ref 6.1–8.1)

## 2017-06-08 LAB — PROTIME-INR
INR: 0.9
Prothrombin Time: 9.9 s (ref 9.0–11.5)

## 2017-06-08 NOTE — Progress Notes (Signed)
Said she is aware and had just spoken to [place].

## 2017-06-08 NOTE — Progress Notes (Signed)
Please let patient know that her liver numbers are better since discharge but still elevated. We need to arrange an US guided liver biopsy next week, reason: elevated LFTs, jaundice, unknown etiology. Negative autoimmune/PBC, ceruloplasmin, iron studies. Negative viral studies.

## 2017-06-11 ENCOUNTER — Other Ambulatory Visit: Payer: Self-pay | Admitting: Radiology

## 2017-06-12 ENCOUNTER — Ambulatory Visit (HOSPITAL_COMMUNITY)
Admission: RE | Admit: 2017-06-12 | Discharge: 2017-06-12 | Disposition: A | Discharge status: Home / Self Care | Payer: PRIVATE HEALTH INSURANCE | Source: Ambulatory Visit | Attending: Gastroenterology | Admitting: Gastroenterology

## 2017-06-12 ENCOUNTER — Encounter (HOSPITAL_COMMUNITY): Payer: Self-pay

## 2017-06-12 DIAGNOSIS — R945 Abnormal results of liver function studies: Secondary | ICD-10-CM | POA: Insufficient documentation

## 2017-06-12 DIAGNOSIS — R7989 Other specified abnormal findings of blood chemistry: Secondary | ICD-10-CM

## 2017-06-12 DIAGNOSIS — R1011 Right upper quadrant pain: Secondary | ICD-10-CM | POA: Insufficient documentation

## 2017-06-12 DIAGNOSIS — R17 Unspecified jaundice: Secondary | ICD-10-CM | POA: Diagnosis present

## 2017-06-12 DIAGNOSIS — Z79899 Other long term (current) drug therapy: Secondary | ICD-10-CM | POA: Diagnosis not present

## 2017-06-12 DIAGNOSIS — K429 Umbilical hernia without obstruction or gangrene: Secondary | ICD-10-CM | POA: Insufficient documentation

## 2017-06-12 DIAGNOSIS — I10 Essential (primary) hypertension: Secondary | ICD-10-CM | POA: Insufficient documentation

## 2017-06-12 DIAGNOSIS — B179 Acute viral hepatitis, unspecified: Secondary | ICD-10-CM | POA: Insufficient documentation

## 2017-06-12 DIAGNOSIS — R7401 Elevation of levels of liver transaminase levels: Secondary | ICD-10-CM

## 2017-06-12 HISTORY — DX: Elevation of levels of liver transaminase levels: R74.01

## 2017-06-12 LAB — PROTIME-INR
INR: 0.93
PROTHROMBIN TIME: 12.4 s (ref 11.4–15.2)

## 2017-06-12 LAB — CBC
HCT: 41.9 % (ref 36.0–46.0)
Hemoglobin: 13.4 g/dL (ref 12.0–15.0)
MCH: 28.2 pg (ref 26.0–34.0)
MCHC: 32 g/dL (ref 30.0–36.0)
MCV: 88 fL (ref 78.0–100.0)
PLATELETS: 395 10*3/uL (ref 150–400)
RBC: 4.76 MIL/uL (ref 3.87–5.11)
RDW: 13.3 % (ref 11.5–15.5)
WBC: 10.8 10*3/uL — AB (ref 4.0–10.5)

## 2017-06-12 LAB — PREGNANCY, URINE: PREG TEST UR: NEGATIVE

## 2017-06-12 LAB — APTT: aPTT: 22 seconds — ABNORMAL LOW (ref 24–36)

## 2017-06-12 MED ORDER — MIDAZOLAM HCL 2 MG/2ML IJ SOLN
INTRAMUSCULAR | Status: AC | PRN
  Administered 2017-06-12 (×2): 0.5 mg via INTRAVENOUS
  Administered 2017-06-12: 1 mg via INTRAVENOUS

## 2017-06-12 MED ORDER — SODIUM CHLORIDE 0.9 % IV SOLN: INTRAVENOUS | Status: DC

## 2017-06-12 MED ORDER — FENTANYL CITRATE (PF) 100 MCG/2ML IJ SOLN
INTRAMUSCULAR | Status: AC | PRN
  Administered 2017-06-12: 50 ug via INTRAVENOUS
  Administered 2017-06-12 (×2): 25 ug via INTRAVENOUS

## 2017-06-12 MED ORDER — FENTANYL CITRATE (PF) 100 MCG/2ML IJ SOLN
INTRAMUSCULAR | Status: AC
  Filled 2017-06-12: qty 2

## 2017-06-12 MED ORDER — LIDOCAINE HCL (PF) 1 % IJ SOLN
INTRAMUSCULAR | Status: AC
  Filled 2017-06-12: qty 10

## 2017-06-12 MED ORDER — SODIUM CHLORIDE 0.9 % IV SOLN
INTRAVENOUS | Status: AC | PRN
  Administered 2017-06-12: 10 mL/h via INTRAVENOUS

## 2017-06-12 MED ORDER — GELATIN ABSORBABLE 12-7 MM EX MISC
CUTANEOUS | Status: AC
  Filled 2017-06-12: qty 1

## 2017-06-12 MED ORDER — MIDAZOLAM HCL 2 MG/2ML IJ SOLN
INTRAMUSCULAR | Status: AC
  Filled 2017-06-12: qty 2

## 2017-06-12 NOTE — Discharge Instructions (Addendum)
Liver Biopsy, Care After °Refer to this sheet in the next few weeks. These instructions provide you with information on caring for yourself after your procedure. Your health care provider may also give you more specific instructions. Your treatment has been planned according to current medical practices, but problems sometimes occur. Call your health care provider if you have any problems or questions after your procedure. °What can I expect after the procedure? °After your procedure, it is typical to have the following: °· A small amount of discomfort in the area where the biopsy was done and in the right shoulder or shoulder blade. °· A small amount of bruising around the area where the biopsy was done and on the skin over the liver. °· Sleepiness and fatigue for the rest of the day. ° °Follow these instructions at home: °· Rest at home for 1-2 days or as directed by your health care provider. °· Have a friend or family member stay with you for at least 24 hours. °· Because of the medicines used during the procedure, you should not do the following things in the first 24 hours: °? Drive. °? Use machinery. °? Be responsible for the care of other people. °? Sign legal documents. °? Take a bath or shower. °· There are many different ways to close and cover an incision, including stitches, skin glue, and adhesive strips. Follow your health care provider's instructions on: °? Incision care. °? Bandage (dressing) changes and removal. °? Incision closure removal. °· Do not drink alcohol in the first week. °· Do not lift more than 5 pounds or play contact sports for 2 weeks after this test. °· Take medicines only as directed by your health care provider. Do not take medicine containing aspirin or non-steroidal anti-inflammatory medicines such as ibuprofen for 1 week after this test. °· It is your responsibility to get your test results. °Contact a health care provider if: °· You have increased bleeding from an incision  that results in more than a small spot of blood. °· You have redness, swelling, or increasing pain in any incisions. °· You notice a discharge or a bad smell coming from any of your incisions. °· You have a fever or chills. °Get help right away if: °· You develop swelling, bloating, or pain in your abdomen. °· You become dizzy or faint. °· You develop a rash. °· You are nauseous or vomit. °· You have difficulty breathing, feel short of breath, or feel faint. °· You develop chest pain. °· You have problems with your speech or vision. °· You have trouble balancing or moving your arms or legs. °This information is not intended to replace advice given to you by your health care provider. Make sure you discuss any questions you have with your health care provider. °Document Released: 01/13/2005 Document Revised: 12/02/2015 Document Reviewed: 08/22/2013 °Elsevier Interactive Patient Education © 2018 [employer] °Moderate Conscious Sedation, Adult, Care After °These instructions provide you with information about caring for yourself after your procedure. Your health care provider may also give you more specific instructions. Your treatment has been planned according to current medical practices, but problems sometimes occur. Call your health care provider if you have any problems or questions after your procedure. °What can I expect after the procedure? °After your procedure, it is common: °· To feel sleepy for several hours. °· To feel clumsy and have poor balance for several hours. °· To have poor judgment for several hours. °· To vomit if you eat   too soon. ° °Follow these instructions at home: °For at least 24 hours after the procedure: ° °· Do not: °? Participate in activities where you could fall or become injured. °? Drive. °? Use heavy machinery. °? Drink alcohol. °? Take sleeping pills or medicines that cause drowsiness. °? Make important decisions or sign legal documents. °? Take care of children on your  own. °· Rest. °Eating and drinking °· Follow the diet recommended by your health care provider. °· If you vomit: °? Drink water, juice, or soup when you can drink without vomiting. °? Make sure you have little or no nausea before eating solid foods. °General instructions °· Have a responsible adult stay with you until you are awake and alert. °· Take over-the-counter and prescription medicines only as told by your health care provider. °· If you smoke, do not smoke without supervision. °· Keep all follow-up visits as told by your health care provider. This is important. °Contact a health care provider if: °· You keep feeling nauseous or you keep vomiting. °· You feel light-headed. °· You develop a rash. °· You have a fever. °Get help right away if: °· You have trouble breathing. °This information is not intended to replace advice given to you by your health care provider. Make sure you discuss any questions you have with your health care provider. °Document Released: 04/16/2013 Document Revised: 11/29/2015 Document Reviewed: 10/16/2015 °Elsevier Interactive Patient Education © 2018 [employer] ° °

## 2017-06-12 NOTE — H&P (Signed)
Chief Complaint: Patient was seen in consultation today for random liver core biopsy at the request of Annitta Needs  Referring Physician(s): Annitta Needs Dr Artis Flock  Supervising Physician: Corrie Mckusick  Patient Status: [facility] - Out-pt  History of Present Illness: Angel Humphrey is a 46 y.o. female   Jaundice RUQ pain Nausea; decreased appetite Was seen in [facility] 06/01/17-06/05/17 All imaging shows no abnormalities Elevated liver functions Was referred to Dr Oneida Alar Now scheduled for liver core biopsy   Past Medical History:  Diagnosis Date  . Diverticulosis   . Hypertension   . Interstitial cystitis     Past Surgical History:  Procedure Laterality Date  . CESAREAN SECTION    . COLONOSCOPY  1998    Allergies: Patient has no known allergies.  Medications: Prior to Admission medications   Medication Sig Start Date End Date Taking? Authorizing Provider  diphenhydrAMINE (BENADRYL) 25 mg capsule Take 1 capsule (25 mg total) by mouth every 6 (six) hours as needed for itching or sleep. 06/05/17   Johnson, Clanford L, MD  losartan-hydrochlorothiazide (HYZAAR) 50-12.5 MG tablet Take 1 tablet by mouth daily.    [provider]  norethindrone (MICRONOR,CAMILA,ERRIN) 0.35 MG tablet Take 1 tablet by mouth daily.    [provider]  omeprazole (PRILOSEC) 40 MG capsule Take 40 mg by mouth daily.    [provider]  ondansetron (ZOFRAN) 4 MG tablet Take 1 tablet (4 mg total) by mouth every 8 (eight) hours as needed for nausea or vomiting. 06/05/17   Murlean Iba, MD     Family History  Problem Relation Age of Onset  . Osteosarcoma Father   . Graves' disease Father     Social History   Socioeconomic History  . Marital status: Married    Spouse name: None  . Number of children: None  . Years of education: None  . Highest education level: None  Social Needs  . Financial resource strain: None  . Food insecurity - worry: None  . Food  insecurity - inability: None  . Transportation needs - medical: None  . Transportation needs - non-medical: None  Occupational History  . None  Tobacco Use  . Smoking status: Never Smoker  . Smokeless tobacco: Never Used  Substance and Sexual Activity  . Alcohol use: No  . Drug use: No  . Sexual activity: None  Other Topics Concern  . None  Social History Narrative  . None    Review of Systems: A 12 point ROS discussed and pertinent positives are indicated in the HPI above.  All other systems are negative.  Review of Systems  Constitutional: Positive for activity change and fatigue. Negative for fever.  Eyes: Negative for visual disturbance.  Respiratory: Negative for shortness of breath.   Gastrointestinal: Positive for nausea. Negative for abdominal pain.  Musculoskeletal: Negative for gait problem.  Neurological: Negative for weakness.  Psychiatric/Behavioral: Negative for behavioral problems and confusion.    Vital Signs: BP 137/85 (BP Location: Right Arm)   Pulse 74   Temp 97.8 F (36.6 C) (Oral)   Ht 5\' 3"  (1.6 m)   Wt 170 lb (77.1 kg)   SpO2 100%   BMI 30.11 kg/m   Physical Exam  Constitutional: She is oriented to person, place, and time. She appears well-nourished.  Eyes: Scleral icterus is present.  Cardiovascular: Normal rate, regular rhythm and normal heart sounds.  Pulmonary/Chest: Effort normal.  Abdominal: Soft. Bowel sounds are normal.  Musculoskeletal: Normal range of  motion.  Neurological: She is alert and oriented to person, place, and time.  Skin: Skin is warm and dry.  Psychiatric: She has a normal mood and affect. Her behavior is normal. Judgment and thought content normal.  Nursing note and vitals reviewed.   Imaging: Ct Abdomen Pelvis W Contrast  Result Date: 06/02/2017 CLINICAL DATA:  Upper abdominal pain and nausea for 5 days. Jaundice. EXAM: CT ABDOMEN AND PELVIS WITH CONTRAST TECHNIQUE: Multidetector CT imaging of the abdomen and  pelvis was performed using the standard protocol following bolus administration of intravenous contrast. CONTRAST:  128mL ISOVUE-300 IOPAMIDOL (ISOVUE-300) INJECTION 61% COMPARISON:  None. FINDINGS: Lower chest: Lung bases are clear. Hepatobiliary: No focal liver abnormality is seen. No gallstones, gallbladder wall thickening, or biliary dilatation. Pancreas: Unremarkable. No pancreatic ductal dilatation or surrounding inflammatory changes. Spleen: Normal in size without focal abnormality. Adrenals/Urinary Tract: Adrenal glands are unremarkable. Kidneys are normal, without renal calculi, focal lesion, or hydronephrosis. Bladder is unremarkable. Stomach/Bowel: Stomach is within normal limits. Appendix is not identified. No evidence of bowel wall thickening, distention, or inflammatory changes. Scattered colonic diverticula without evidence of diverticulitis. Vascular/Lymphatic: No significant vascular findings are present. No enlarged abdominal or pelvic lymph nodes. Reproductive: Heterogeneous nodular appearance to the uterus probably representing small fibroids. Uterus and ovaries are not enlarged. Other: Small periumbilical hernia containing fat. No free air or free fluid in the abdomen. Musculoskeletal: No acute or significant osseous findings. IMPRESSION: 1. No abnormality identified to account for history jaundice. 2. Probable uterine fibroids. 3. Small periumbilical hernia containing fat. Electronically Signed   By: Lucienne Capers M.D.   On: 06/02/2017 00:09   Mr 3d Recon At Scanner  Result Date: 06/04/2017 CLINICAL DATA:  Abdominal pain and nausea for 5 days. Jaundice. Elevated liver function tests. EXAM: MRI ABDOMEN WITHOUT AND WITH CONTRAST (INCLUDING MRCP) TECHNIQUE: Multiplanar multisequence MR imaging of the abdomen was performed both before and after the administration of intravenous contrast. Heavily T2-weighted images of the biliary and pancreatic ducts were obtained, and three-dimensional MRCP  images were rendered by post processing. CONTRAST:  34mL MULTIHANCE GADOBENATE DIMEGLUMINE 529 MG/ML IV SOLN COMPARISON:  CT on 06/01/2017 FINDINGS: Lower chest: No acute findings. Hepatobiliary: No hepatic masses identified. Tiny sub-cm subcapsular cyst seen in posterior right hepatic lobe. No evidence of steatosis on chemical shift imaging. Gallbladder is unremarkable. No significant biliary ductal dilatation with common bile duct measuring 6 mm. No evidence of choledocholithiasis. Pancreas: No mass or inflammatory changes. No evidence of pancreatic ductal dilatation. Spleen:  Within normal limits in size and appearance. Adrenals/Urinary Tract: No masses identified. No evidence of hydronephrosis. Stomach/Bowel: Visualized portions within the abdomen are unremarkable. Vascular/Lymphatic: No pathologically enlarged lymph nodes identified. No abdominal aortic aneurysm. Other:  None. Musculoskeletal:  No suspicious bone lesions identified. IMPRESSION: No radiographic evidence of hepatic pathology, biliary ductal dilatation, or other significant abnormality. Electronically Signed   By: Earle Gell M.D.   On: 06/04/2017 09:19   Mr Abdomen Mrcp Moise Boring Contast  Result Date: 06/04/2017 CLINICAL DATA:  Abdominal pain and nausea for 5 days. Jaundice. Elevated liver function tests. EXAM: MRI ABDOMEN WITHOUT AND WITH CONTRAST (INCLUDING MRCP) TECHNIQUE: Multiplanar multisequence MR imaging of the abdomen was performed both before and after the administration of intravenous contrast. Heavily T2-weighted images of the biliary and pancreatic ducts were obtained, and three-dimensional MRCP images were rendered by post processing. CONTRAST:  42mL MULTIHANCE GADOBENATE DIMEGLUMINE 529 MG/ML IV SOLN COMPARISON:  CT on 06/01/2017 FINDINGS: Lower chest: No acute  findings. Hepatobiliary: No hepatic masses identified. Tiny sub-cm subcapsular cyst seen in posterior right hepatic lobe. No evidence of steatosis on chemical shift imaging.  Gallbladder is unremarkable. No significant biliary ductal dilatation with common bile duct measuring 6 mm. No evidence of choledocholithiasis. Pancreas: No mass or inflammatory changes. No evidence of pancreatic ductal dilatation. Spleen:  Within normal limits in size and appearance. Adrenals/Urinary Tract: No masses identified. No evidence of hydronephrosis. Stomach/Bowel: Visualized portions within the abdomen are unremarkable. Vascular/Lymphatic: No pathologically enlarged lymph nodes identified. No abdominal aortic aneurysm. Other:  None. Musculoskeletal:  No suspicious bone lesions identified. IMPRESSION: No radiographic evidence of hepatic pathology, biliary ductal dilatation, or other significant abnormality. Electronically Signed   By: Earle Gell M.D.   On: 06/04/2017 09:19   US Abdomen Limited Ruq  Result Date: 06/02/2017 CLINICAL DATA:  Acute hepatitis. Evaluate for gallstone. Upper back and epigastric pain. EXAM: ULTRASOUND ABDOMEN LIMITED RIGHT UPPER QUADRANT COMPARISON:  Abdominal CT 06/01/2017. FINDINGS: Gallbladder: No gallstones or wall thickening visualized. No sonographic Murphy sign noted by sonographer. Common bile duct: Diameter: 0.7 cm. Review of the recent CT demonstrates a small focus of gas in the common bile duct. Findings raise concern for a small CBD stone at this location. Liver: No focal lesion identified. Within normal limits in parenchymal echogenicity. Portal vein is patent on color Doppler imaging with normal direction of blood flow towards the liver. IMPRESSION: Common bile duct is mildly dilated. Review of recent CT raises concern for a CBD stone. Consider further characterization with MRCP. No gallstones. Electronically Signed   By: Markus Daft M.D.   On: 06/02/2017 12:17    Labs:  CBC: Recent Labs    06/02/17 0950 06/03/17 0742 06/04/17 0637 06/05/17 0651  WBC 8.5 7.9 9.5 8.9  HGB 12.6 12.0 12.8 12.7  HCT 39.1 38.3 40.6 40.5  PLT 278 327 362 384     COAGS: Recent Labs    06/02/17 0950 06/04/17 1202 06/05/17 0651 06/07/17 0854  INR 0.96 0.93 0.90 0.9    BMP: Recent Labs    06/02/17 0950 06/03/17 0742 06/04/17 0637 06/05/17 0651  NA 139 138 137 137  K 3.4* 4.3 3.7 3.9  CL 108 106 102 102  CO2 24 24 24 26   GLUCOSE 110* 118* 144* 150*  BUN 6 <5* 7 8  CALCIUM 9.0 9.5 9.4 9.7  CREATININE 0.42* 0.38* 0.48 0.54  GFRNONAA >60 >60 >60 >60  GFRAA >60 >60 >60 >60    LIVER FUNCTION TESTS: Recent Labs    06/02/17 0950 06/03/17 0742 06/04/17 0637 06/05/17 0651 06/07/17 0854  BILITOT 6.4* 7.3* 7.2* 7.0* 4.7*  AST 80* 99* 120* 136* 119*  ALT 147* 158* 176* 191* 202*  ALKPHOS 318* 344* 384* 413*  --   PROT 7.1 7.2 7.6 7.6 7.3  ALBUMIN 3.1* 3.3* 3.4* 3.4*  --     TUMOR MARKERS: No results for input(s): AFPTM, CEA, CA199, CHROMGRNA in the last 8760 hours.  Assessment and Plan:  Elevated liver enzymes Jaundice abd pain Imaging without abnormality Now scheduled for liver core biopsy Risks and benefits discussed with the patient including, but not limited to bleeding, infection, damage to adjacent structures or low yield requiring additional tests. All of the patient's questions were answered, patient is agreeable to proceed. Consent signed and in chart.   Thank you for this interesting consult.  I greatly enjoyed meeting LIZBETH FEIJOO and look forward to participating in their care.  A copy of this report  was sent to the requesting provider on this date.  Electronically Signed: Lavonia Drafts, PA-C 06/12/2017, 12:14 PM   I spent a total of  30 Minutes   in face to face in clinical consultation, greater than 50% of which was counseling/coordinating care for liver core biopsy

## 2017-06-12 NOTE — Procedures (Signed)
Interventional Radiology Procedure Note  Procedure: US guided medical liver biopsy.   Complications: None Recommendations:  - Ok to shower tomorrow - Do not submerge for 7 days - Routine wound care - 2 hr observation   Signed,  Dulcy Fanny. Earleen Newport, DO

## 2017-06-13 ENCOUNTER — Other Ambulatory Visit: Payer: Self-pay

## 2017-06-13 ENCOUNTER — Telehealth: Payer: Self-pay | Admitting: Gastroenterology

## 2017-06-13 DIAGNOSIS — R7989 Other specified abnormal findings of blood chemistry: Secondary | ICD-10-CM

## 2017-06-13 DIAGNOSIS — R945 Abnormal results of liver function studies: Principal | ICD-10-CM

## 2017-06-13 NOTE — Telephone Encounter (Signed)
I am still waiting on pathology from liver biopsy, but I would like to check HFP again before this week is up. Forms are ready as well, if she would like to come pick these up.

## 2017-06-13 NOTE — Telephone Encounter (Signed)
PT is aware and will go to the lab tomorrow and pick up papers tomorrow also.

## 2017-06-14 LAB — HEPATIC FUNCTION PANEL
AG RATIO: 1.3 (calc) (ref 1.0–2.5)
ALBUMIN MSPROF: 3.9 g/dL (ref 3.6–5.1)
ALT: 101 U/L — AB (ref 6–29)
AST: 52 U/L — ABNORMAL HIGH (ref 10–35)
Alkaline phosphatase (APISO): 282 U/L — ABNORMAL HIGH (ref 33–115)
Bilirubin, Direct: 0.8 mg/dL — ABNORMAL HIGH (ref 0.0–0.2)
GLOBULIN: 3.1 g/dL (ref 1.9–3.7)
Indirect Bilirubin: 1 mg/dL (calc) (ref 0.2–1.2)
Total Bilirubin: 1.8 mg/dL — ABNORMAL HIGH (ref 0.2–1.2)
Total Protein: 7 g/dL (ref 6.1–8.1)

## 2017-06-15 ENCOUNTER — Encounter: Payer: Self-pay | Admitting: Gastroenterology

## 2017-06-15 ENCOUNTER — Ambulatory Visit (INDEPENDENT_AMBULATORY_CARE_PROVIDER_SITE_OTHER): Payer: PRIVATE HEALTH INSURANCE | Admitting: Gastroenterology

## 2017-06-15 DIAGNOSIS — R945 Abnormal results of liver function studies: Secondary | ICD-10-CM

## 2017-06-15 DIAGNOSIS — R7989 Other specified abnormal findings of blood chemistry: Secondary | ICD-10-CM

## 2017-06-15 MED ORDER — PROMETHAZINE HCL 25 MG PO TABS: 12.5000 mg | ORAL_TABLET | Freq: Three times a day (TID) | ORAL | 0 refills | Status: DC | PRN

## 2017-06-15 MED ORDER — ONDANSETRON HCL 4 MG PO TABS: 4.0000 mg | ORAL_TABLET | Freq: Three times a day (TID) | ORAL | 1 refills | Status: AC | PRN

## 2017-06-15 NOTE — Patient Instructions (Signed)
Please complete the labs next week (around Wednesday or before end of week).   The liver biopsy result should hopefully be signed off on today!  Further recommendations once this is completed.

## 2017-06-15 NOTE — Progress Notes (Signed)
Pathology from liver biopsy reviewed.  "The biopsy fragments reveal benign hepatic parenchyma with mild chronic inflammation involving most portal tracts. There is mild pigment deposition. The inflammatory infiltrate consists of lymphocytes and a few plasma cells. No significant interface hepatitis nor hepatocyte necrosis is identified. No significant steatosis is identified. Iron, reticulin, trichome, and PAS stains are essentially normal. Overall, the findings are relatively mild. The differential diagnosis includes autoimmune hepatitis among other entities." Chronic hepatitis was noted. I will review further with Dr. Oneida Alar. Patient may need referral to tertiary center as other labs have been unrevealing thus far.

## 2017-06-15 NOTE — Progress Notes (Addendum)
Hepatic Function Latest Ref Rng & Units 06/14/2017 06/07/2017 06/05/2017  Total Protein 6.1 - 8.1 g/dL 7.0 7.3 7.6  Albumin 3.5 - 5.0 g/dL - - 3.4(L)  AST 10 - 35 U/L 52(H) 119(H) 136(H)  ALT 6 - 29 U/L 101(H) 202(H) 191(H)  Alk Phosphatase 38 - 126 U/L - - 413(H)  Total Bilirubin 0.2 - 1.2 mg/dL 1.8(H) 4.7(H) 7.0(H)  Bilirubin, Direct 0.0 - 0.2 mg/dL 0.8(H) 2.3(H) -     TRANSIENT ELEVATION IN LIVER ENZYMES. LIVER ENZYMES TRENDING DOWN TO NORMAL. ELEVATED LIVER ENZYMES MOST LIKELY DUE TO MICROLITHIASIS, LESS LIKELY IDIOSYNCRATIC DRUG REACTION.  Referring Provider: Yvone Neu Primary Care Physician:  Yvone Neu, MD  Primary GI: Dr. Oneida Alar   Chief Complaint  Patient presents with  . Hepatitis    hosp f/u  . Nausea    HPI:   Angel Humphrey is a 46 y.o. female presenting today in hospital follow-up for jaundice, elevated LFTs. Negative hepatitis panel, no evidence of CBD stones on MRCP. iron studies were normal, ceruloplasmin elevated and not decreased so this was unlikely Wilson's, alpha-1 antitrypsin elevated and not deficient, ANA, ASMA, and AMA all normal, and immunoglobulins normal.   Recently checked LFTs after discharge improving significantly. Still with nausea but denies pain. Feels tired. Jaundice much improved. No mental status changes or confusion. Only sore at site of liver biopsy.   Dr. Margo Aye would like liver biopsy results. [place] ([place]). Phone #: [phone]  Past Medical History:  Diagnosis Date  . Diverticulosis   . Hypertension   . Interstitial cystitis     Past Surgical History:  Procedure Laterality Date  . CESAREAN SECTION    . COLONOSCOPY  1998    Current Outpatient Medications  Medication Sig Dispense Refill  . diphenhydrAMINE (BENADRYL) 25 mg capsule Take 1 capsule (25 mg total) by mouth every 6 (six) hours as needed for itching or sleep. 30 capsule 0  . losartan-hydrochlorothiazide (HYZAAR)  50-12.5 MG tablet Take 1 tablet by mouth daily.    . norethindrone (MICRONOR,CAMILA,ERRIN) 0.35 MG tablet Take 1 tablet by mouth daily.    Marland Kitchen omeprazole (PRILOSEC) 40 MG capsule Take 40 mg by mouth daily.    . ondansetron (ZOFRAN) 4 MG tablet Take 1 tablet (4 mg total) by mouth every 8 (eight) hours as needed for nausea or vomiting. 10 tablet 0  . ondansetron (ZOFRAN) 4 MG tablet Take 1 tablet (4 mg total) by mouth every 8 (eight) hours as needed for nausea or vomiting. 90 tablet 1  . promethazine (PHENERGAN) 25 MG tablet Take 0.5 tablets (12.5 mg total) by mouth every 8 (eight) hours as needed for nausea or vomiting. May take whole tablet if needed. 30 tablet 0   No current facility-administered medications for this visit.     Allergies as of 06/15/2017  . (No Known Allergies)    Family History  Problem Relation Age of Onset  . Osteosarcoma Father   . Graves' disease Father     Social History   Socioeconomic History  . Marital status: Married    Spouse name: None  . Number of children: None  . Years of education: None  . Highest education level: None  Social Needs  . Financial resource strain: None  . Food insecurity - worry: None  . Food insecurity - inability: None  . Transportation needs - medical: None  . Transportation needs - non-medical: None  Occupational History  . None  Tobacco Use  .  Smoking status: Never Smoker  . Smokeless tobacco: Never Used  Substance and Sexual Activity  . Alcohol use: No  . Drug use: No  . Sexual activity: None  Other Topics Concern  . None  Social History Narrative  . None    Review of Systems: Gen: see HPI  CV: Denies chest pain, palpitations, syncope, peripheral edema, and claudication. Resp: Denies dyspnea at rest, cough, wheezing, coughing up blood, and pleurisy. GI: see HPI  Derm: Denies rash, itching, dry skin Psych: Denies depression, anxiety, memory loss, confusion. No homicidal or suicidal ideation.  Heme: Denies  bruising, bleeding, and enlarged lymph nodes.  Physical Exam: BP 121/71   Pulse 85   Temp 98.4 F (36.9 C) (Oral)   Ht _0  (1.6 m)   Wt 168 lb 3.2 oz (76.3 kg)   BMI 29.80 kg/m  General:   Alert and oriented. No distress noted. Pleasant and cooperative.  Head:  Normocephalic and atraumatic. Eyes:  Conjuctiva clear without scleral icterus. Mouth:  Oral mucosa pink and moist.  Abdomen:  +BS, soft, mild TTP RUQ and non-distended. No rebound or guarding. No HSM or masses noted. Msk:  Symmetrical without gross deformities. Normal posture. Extremities:  Without edema. Neurologic:  Alert and  oriented x4 Psych:  Alert and cooperative. Normal mood and affect.  Lab Results  Component Value Date   ALT 101 (H) 06/14/2017   AST 52 (H) 06/14/2017   ALKPHOS 413 (H) 06/05/2017   BILITOT 1.8 (H) 06/14/2017   Lab Results  Component Value Date   INR 0.93 06/12/2017   INR 0.9 06/07/2017   INR 0.90 06/05/2017

## 2017-06-15 NOTE — Assessment & Plan Note (Signed)
46 year old female with recent hospital admission secondary to jaundice and mixed pattern elevated LFTs. Thus far, all serologies have been unrevealing, MRCP negative, and a liver biopsy was completed. She continues to deny any OTC herbal agents, NSAIDs, alcohol use. Awaiting liver biopsy results. Recheck HFP next week. Will need to follow to normal. Follow-up to be determined after review of liver biopsy.   Due to nausea, Zofran was refilled. As needed low dose Phenergan provided only for severe nausea.

## 2017-06-20 ENCOUNTER — Telehealth: Payer: Self-pay | Admitting: Gastroenterology

## 2017-06-20 ENCOUNTER — Encounter: Payer: Self-pay | Admitting: Gastroenterology

## 2017-06-20 DIAGNOSIS — R7989 Other specified abnormal findings of blood chemistry: Secondary | ICD-10-CM

## 2017-06-20 DIAGNOSIS — R945 Abnormal results of liver function studies: Principal | ICD-10-CM

## 2017-06-20 LAB — HEPATIC FUNCTION PANEL
AG RATIO: 1.4 (calc) (ref 1.0–2.5)
ALKALINE PHOSPHATASE (APISO): 235 U/L — AB (ref 33–115)
ALT: 66 U/L — AB (ref 6–29)
AST: 34 U/L (ref 10–35)
Albumin: 4.1 g/dL (ref 3.6–5.1)
BILIRUBIN INDIRECT: 0.9 mg/dL (ref 0.2–1.2)
BILIRUBIN TOTAL: 1.5 mg/dL — AB (ref 0.2–1.2)
Bilirubin, Direct: 0.6 mg/dL — ABNORMAL HIGH (ref 0.0–0.2)
Globulin: 2.9 g/dL (calc) (ref 1.9–3.7)
TOTAL PROTEIN: 7 g/dL (ref 6.1–8.1)

## 2017-06-20 NOTE — Addendum Note (Signed)
Addended by: Inge Rise on: 06/20/2017 11:29 AM   Modules accepted: Orders

## 2017-06-20 NOTE — Telephone Encounter (Signed)
[place] PATIENT IF YOU HAVE DISCUSSED HER ISSUE WITH [facility].

## 2017-06-20 NOTE — Telephone Encounter (Signed)
Referral has been placed. 

## 2017-06-20 NOTE — Telephone Encounter (Signed)
Reviewed with Dr. Oneida Alar the liver biopsy. She will touch base with pathologist in future, but for now needs to be referred to Dr. Arnoldo Morale or Dr. Constance Haw for consideration of cholecystectomy. She was inpatient with elevated LFTs but no obvious CBD obstruction. They are slowly trending down. Concern for microlithiasis as possible culprit here, so to be thorough, needs evaluation for consideration of cholecystectomy. Patient is aware of this plan. Please refer to surgery. Thanks!

## 2017-06-20 NOTE — Progress Notes (Signed)
CC'D TO PCP °

## 2017-06-21 ENCOUNTER — Other Ambulatory Visit: Payer: Self-pay

## 2017-06-21 DIAGNOSIS — R945 Abnormal results of liver function studies: Secondary | ICD-10-CM

## 2017-06-21 DIAGNOSIS — R7989 Other specified abnormal findings of blood chemistry: Secondary | ICD-10-CM

## 2017-06-21 NOTE — Progress Notes (Signed)
PT is aware of results and plan. She will go to the lab in one week to recheck LFT's. OK to make referral. She is checking on her insurance to see if she needs to go to [facility] or [facility]. Said she will call and speak to [place] or Mindy to let them know where to send referral to.

## 2017-06-21 NOTE — Progress Notes (Signed)
LFTs continuing to improve. Referring to surgery for consideration of cholecystectomy. Can we have her recheck this in 1 week? We are following to normal.

## 2017-06-21 NOTE — Telephone Encounter (Signed)
Patient called and [facility] is not in network with her insurance. She reports [facility] is in network and prefers to be sent to them. I have sent referral over to them. FYI to Lyn[place].

## 2017-06-22 NOTE — Telephone Encounter (Signed)
Great!

## 2017-06-26 ENCOUNTER — Ambulatory Visit: Payer: Self-pay | Admitting: General Surgery

## 2017-06-26 LAB — HEPATIC FUNCTION PANEL
AG Ratio: 1.4 (calc) (ref 1.0–2.5)
ALT: 53 U/L — ABNORMAL HIGH (ref 6–29)
AST: 35 U/L (ref 10–35)
Albumin: 3.9 g/dL (ref 3.6–5.1)
Alkaline phosphatase (APISO): 188 U/L — ABNORMAL HIGH (ref 33–115)
BILIRUBIN DIRECT: 0.4 mg/dL — AB (ref 0.0–0.2)
BILIRUBIN INDIRECT: 0.9 mg/dL (ref 0.2–1.2)
BILIRUBIN TOTAL: 1.3 mg/dL — AB (ref 0.2–1.2)
GLOBULIN: 2.7 g/dL (ref 1.9–3.7)
Total Protein: 6.6 g/dL (ref 6.1–8.1)

## 2017-06-26 NOTE — H&P (Signed)
History of Present Illness Angel Humphrey; 06/26/2017 3:17 PM) The patient is a 46 year old female who presents for evaluation of gall stones. Referred by: Dr. Lovey Newcomer fields Chief Complaint: Right upper quadrant pain  Patient is a 46 year old female who was recently hospitalized secondary to abdominal pain, jaundice, nausea. Patient underwent further workup which revealed elevated T bili of the course of her hospitalization this declined. Patient underwent ultrasound, CT scan, MRCP. I reviewed these personally. None of these studies revealed any gallstones. Patient did have some dilation of intrahepatic ducts on CT scan. There was no common duct obstruction on MRCP. Patient also underwent hepatitis C panel which was negative   Patient continues to have abdominal pain that she states the right upper quadrant and radiates to the back. She states she's has associated nausea however no emesis.    Past Surgical History Benjiman Core, [place]; 06/26/2017 2:34 PM) Cesarean Section - 1   Diagnostic Studies History Benjiman Core, [place]; 06/26/2017 2:34 PM) Colonoscopy  >10 years ago Mammogram  within last year Pap Smear  1-5 years ago  Allergies Benjiman Core, [place]; 06/26/2017 2:36 PM) No Known Drug Allergies [06/26/2017]:  Medication History Benjiman Core, CMA; 06/26/2017 2:37 PM) Cefdinir (300MG  Capsule, Oral) Active. Losartan Potassium-HCTZ (50-12.5MG  Tablet, Oral) Active. Norethindrone (0.35MG  Tablet, Oral) Active. Omeprazole (40MG  Capsule DR, Oral) Active. Ondansetron (8MG  Tablet Disint, Oral) Active. Medications Reconciled  Social History Benjiman Core, CMA; 06/26/2017 2:34 PM) Caffeine use  Carbonated beverages, Tea. No alcohol use  No drug use  Tobacco use  Never smoker.  Family History Benjiman Core, [place]; 06/26/2017 2:34 PM) Alcohol Abuse  Father. Cancer  Father. Diabetes Mellitus  Sister. Heart Disease  Father. Hypertension  Father. Thyroid problems   Sister.  Pregnancy / Birth History Benjiman Core, [place]; 06/26/2017 2:34 PM) Contraceptive History  Oral contraceptives. Gravida  1 Irregular periods  Length (months) of breastfeeding  3-6 Maternal age  53-30 Para  1  Other Problems Benjiman Core, [place]; 06/26/2017 2:34 PM) Gastroesophageal Reflux Disease  High blood pressure  Migraine Headache     Review of Systems Angel Humphrey; 06/26/2017 3:15 PM) General Not Present- Appetite Loss, Chills, Fatigue, Fever, Night Sweats, Weight Gain and Weight Loss. Skin Present- Jaundice. Not Present- Change in Wart/Mole, Dryness, Hives, New Lesions, Non-Healing Wounds, Rash and Ulcer. HEENT Present- Yellow Eyes. Not Present- Earache, Hearing Loss, Hoarseness, Nose Bleed, Oral Ulcers, Ringing in the Ears, Seasonal Allergies, Sinus Pain, Sore Throat, Visual Disturbances and Wears glasses/contact lenses. Respiratory Not Present- Bloody sputum, Chronic Cough, Difficulty Breathing, Snoring and Wheezing. Breast Not Present- Breast Mass, Breast Pain, Nipple Discharge and Skin Changes. Cardiovascular Not Present- Chest Pain, Difficulty Breathing Lying Down, Leg Cramps, Palpitations, Rapid Heart Rate, Shortness of Breath and Swelling of Extremities. Gastrointestinal Present- Abdominal Pain and Nausea. Not Present- Bloating, Bloody Stool, Change in Bowel Habits, Chronic diarrhea, Constipation, Difficulty Swallowing, Excessive gas, Gets full quickly at meals, Hemorrhoids, Indigestion, Rectal Pain and Vomiting. Female Genitourinary Not Present- Frequency, Nocturia, Painful Urination, Pelvic Pain and Urgency. Musculoskeletal Present- Back Pain. Not Present- Joint Pain, Joint Stiffness, Muscle Pain, Muscle Weakness and Swelling of Extremities. Neurological Not Present- Decreased Memory, Fainting, Headaches, Numbness, Seizures, Tingling, Tremor, Trouble walking and Weakness. Psychiatric Not Present- Anxiety, Bipolar, Change in Sleep Pattern, Depression,  Fearful and Frequent crying. Endocrine Not Present- Cold Intolerance, Excessive Hunger, Hair Changes, Heat Intolerance, Hot flashes and New Diabetes. Hematology Not Present- Blood Thinners, Easy Bruising, Excessive bleeding, Gland problems, HIV and Persistent Infections. All other systems  negative  Vitals (Armen Glenn CMA; 06/26/2017 2:36 PM) 06/26/2017 2:35 PM Weight: 175 lb Height: 63in Body Surface Area: 1.83 m Body Mass Index: 31 kg/m  Temp.: 98.53F  Pulse: 86 (Regular)  P.OX: 98% (Room air) BP: 138/80 (Sitting, Left Arm, Standard)       Physical Exam Angel Humphrey; 06/26/2017 3:18 PM) The physical exam findings are as follows: Note:Constitutional: No acute distress, conversant, appears stated age  Eyes: Anicteric sclerae, moist conjunctiva, no lid lag  Neck: No thyromegaly, trachea midline, no cervical lymphadenopathy  Lungs: Clear to auscultation biilaterally, normal respiratory effot  Cardiovascular: regular rate & rhythm, no murmurs, no peripheal edema, pedal pulses 2+  GI: Soft, no masses or hepatosplenomegaly, non-tender to palpation  MSK: Normal gait, no clubbing cyanosis, edema  Skin: No rashes, palpation reveals normal skin turgor  Psychiatric: Appropriate judgment and insight, oriented to person, place, and time    Assessment & Plan Angel Humphrey; 06/26/2017 3:18 PM) SYMPTOMATIC CHOLELITHIASIS (K80.20) Impression: 46 year old female with likely passage of common duct stone. 1. We will proceed to the operating room for a laparoscopic cholecystectomy  2. Risks and benefits were discussed with the patient to generally include, but not limited to: infection, bleeding, possible need for post op ERCP, damage to the bile ducts, bile leak, and possible need for further surgery. Alternatives were offered and described. All questions were answered and the patient voiced understanding of the procedure and wishes to proceed at this point with  a laparoscopic cholecystectomy

## 2017-06-26 NOTE — H&P (View-Only) (Signed)
History of Present Illness Ralene Ok MD; 06/26/2017 3:17 PM) The patient is a 46 year old female who presents for evaluation of gall stones. Referred by: Dr. Lovey Newcomer fields Chief Complaint: Right upper quadrant pain  Patient is a 46 year old female who was recently hospitalized secondary to abdominal pain, jaundice, nausea. Patient underwent further workup which revealed elevated T bili of the course of her hospitalization this declined. Patient underwent ultrasound, CT scan, MRCP. I reviewed these personally. None of these studies revealed any gallstones. Patient did have some dilation of intrahepatic ducts on CT scan. There was no common duct obstruction on MRCP. Patient also underwent hepatitis C panel which was negative   Patient continues to have abdominal pain that she states the right upper quadrant and radiates to the back. She states she's has associated nausea however no emesis.    Past Surgical History Benjiman Core, [place]; 06/26/2017 2:34 PM) Cesarean Section - 1   Diagnostic Studies History Benjiman Core, [place]; 06/26/2017 2:34 PM) Colonoscopy  >10 years ago Mammogram  within last year Pap Smear  1-5 years ago  Allergies Benjiman Core, [place]; 06/26/2017 2:36 PM) No Known Drug Allergies [06/26/2017]:  Medication History Benjiman Core, CMA; 06/26/2017 2:37 PM) Cefdinir (300MG  Capsule, Oral) Active. Losartan Potassium-HCTZ (50-12.5MG  Tablet, Oral) Active. Norethindrone (0.35MG  Tablet, Oral) Active. Omeprazole (40MG  Capsule DR, Oral) Active. Ondansetron (8MG  Tablet Disint, Oral) Active. Medications Reconciled  Social History Benjiman Core, CMA; 06/26/2017 2:34 PM) Caffeine use  Carbonated beverages, Tea. No alcohol use  No drug use  Tobacco use  Never smoker.  Family History Benjiman Core, [place]; 06/26/2017 2:34 PM) Alcohol Abuse  Father. Cancer  Father. Diabetes Mellitus  Sister. Heart Disease  Father. Hypertension  Father. Thyroid problems   Sister.  Pregnancy / Birth History Benjiman Core, [place]; 06/26/2017 2:34 PM) Contraceptive History  Oral contraceptives. Gravida  1 Irregular periods  Length (months) of breastfeeding  3-6 Maternal age  52-30 Para  1  Other Problems Benjiman Core, [place]; 06/26/2017 2:34 PM) Gastroesophageal Reflux Disease  High blood pressure  Migraine Headache     Review of Systems Ralene Ok MD; 06/26/2017 3:15 PM) General Not Present- Appetite Loss, Chills, Fatigue, Fever, Night Sweats, Weight Gain and Weight Loss. Skin Present- Jaundice. Not Present- Change in Wart/Mole, Dryness, Hives, New Lesions, Non-Healing Wounds, Rash and Ulcer. HEENT Present- Yellow Eyes. Not Present- Earache, Hearing Loss, Hoarseness, Nose Bleed, Oral Ulcers, Ringing in the Ears, Seasonal Allergies, Sinus Pain, Sore Throat, Visual Disturbances and Wears glasses/contact lenses. Respiratory Not Present- Bloody sputum, Chronic Cough, Difficulty Breathing, Snoring and Wheezing. Breast Not Present- Breast Mass, Breast Pain, Nipple Discharge and Skin Changes. Cardiovascular Not Present- Chest Pain, Difficulty Breathing Lying Down, Leg Cramps, Palpitations, Rapid Heart Rate, Shortness of Breath and Swelling of Extremities. Gastrointestinal Present- Abdominal Pain and Nausea. Not Present- Bloating, Bloody Stool, Change in Bowel Habits, Chronic diarrhea, Constipation, Difficulty Swallowing, Excessive gas, Gets full quickly at meals, Hemorrhoids, Indigestion, Rectal Pain and Vomiting. Female Genitourinary Not Present- Frequency, Nocturia, Painful Urination, Pelvic Pain and Urgency. Musculoskeletal Present- Back Pain. Not Present- Joint Pain, Joint Stiffness, Muscle Pain, Muscle Weakness and Swelling of Extremities. Neurological Not Present- Decreased Memory, Fainting, Headaches, Numbness, Seizures, Tingling, Tremor, Trouble walking and Weakness. Psychiatric Not Present- Anxiety, Bipolar, Change in Sleep Pattern, Depression,  Fearful and Frequent crying. Endocrine Not Present- Cold Intolerance, Excessive Hunger, Hair Changes, Heat Intolerance, Hot flashes and New Diabetes. Hematology Not Present- Blood Thinners, Easy Bruising, Excessive bleeding, Gland problems, HIV and Persistent Infections. All other systems  negative  Vitals (Armen Glenn CMA; 06/26/2017 2:36 PM) 06/26/2017 2:35 PM Weight: 175 lb Height: 63in Body Surface Area: 1.83 m Body Mass Index: 31 kg/m  Temp.: 98.61F  Pulse: 86 (Regular)  P.OX: 98% (Room air) BP: 138/80 (Sitting, Left Arm, Standard)       Physical Exam Ralene Ok MD; 06/26/2017 3:18 PM) The physical exam findings are as follows: Note:Constitutional: No acute distress, conversant, appears stated age  Eyes: Anicteric sclerae, moist conjunctiva, no lid lag  Neck: No thyromegaly, trachea midline, no cervical lymphadenopathy  Lungs: Clear to auscultation biilaterally, normal respiratory effot  Cardiovascular: regular rate & rhythm, no murmurs, no peripheal edema, pedal pulses 2+  GI: Soft, no masses or hepatosplenomegaly, non-tender to palpation  MSK: Normal gait, no clubbing cyanosis, edema  Skin: No rashes, palpation reveals normal skin turgor  Psychiatric: Appropriate judgment and insight, oriented to person, place, and time    Assessment & Plan Ralene Ok MD; 06/26/2017 3:18 PM) SYMPTOMATIC CHOLELITHIASIS (K80.20) Impression: 46 year old female with likely passage of common duct stone. 1. We will proceed to the operating room for a laparoscopic cholecystectomy  2. Risks and benefits were discussed with the patient to generally include, but not limited to: infection, bleeding, possible need for post op ERCP, damage to the bile ducts, bile leak, and possible need for further surgery. Alternatives were offered and described. All questions were answered and the patient voiced understanding of the procedure and wishes to proceed at this point with  a laparoscopic cholecystectomy

## 2017-06-27 ENCOUNTER — Telehealth: Payer: Self-pay | Admitting: Gastroenterology

## 2017-06-27 NOTE — Telephone Encounter (Signed)
Sure can! Does she have actual forms I need to fill out or just a letter?

## 2017-06-27 NOTE — Telephone Encounter (Signed)
Pt said she just needs a letter saying she can be out from 07/05/2017 through 1-03/2018.  The letter can be faxed to [phone]  ATTN: Sylvester Harder.

## 2017-06-27 NOTE — Telephone Encounter (Signed)
[phone] patient called inquiring if Angel Humphrey could take her out of work until January 9th.  She is having surgery then for her gallbladder. She was originally released from work until 07/04/17

## 2017-06-27 NOTE — Telephone Encounter (Signed)
[phone]  PLEASE CALL PATIENT, SHE HAS INFORMATION THAT IS NEEDED FOR HER HUMAN RESOURCES OFFICE SO SHE CAN BE TAKEN OUT OF WORK

## 2017-06-27 NOTE — Telephone Encounter (Signed)
Does she need longer? Surgery is the 9th I thought.

## 2017-06-27 NOTE — Telephone Encounter (Signed)
See previous letter.

## 2017-06-28 ENCOUNTER — Encounter: Payer: Self-pay | Admitting: Gastroenterology

## 2017-06-28 NOTE — Telephone Encounter (Signed)
PT had said til her surgery.

## 2017-06-28 NOTE — Telephone Encounter (Signed)
Letter has been faxed and pt is aware.

## 2017-06-28 NOTE — Telephone Encounter (Signed)
Completed letter.

## 2017-07-02 ENCOUNTER — Other Ambulatory Visit: Payer: Self-pay

## 2017-07-02 DIAGNOSIS — R7989 Other specified abnormal findings of blood chemistry: Secondary | ICD-10-CM

## 2017-07-02 DIAGNOSIS — R945 Abnormal results of liver function studies: Principal | ICD-10-CM

## 2017-07-02 NOTE — Progress Notes (Signed)
LMOM for pt and told her to go to the lab around 07/12/2017.

## 2017-07-09 LAB — HEPATIC FUNCTION PANEL
AG Ratio: 1.5 (calc) (ref 1.0–2.5)
ALKALINE PHOSPHATASE (APISO): 180 U/L — AB (ref 33–115)
ALT: 49 U/L — AB (ref 6–29)
AST: 29 U/L (ref 10–35)
Albumin: 4 g/dL (ref 3.6–5.1)
Bilirubin, Direct: 0.3 mg/dL — ABNORMAL HIGH (ref 0.0–0.2)
Globulin: 2.6 g/dL (calc) (ref 1.9–3.7)
Indirect Bilirubin: 0.7 mg/dL (calc) (ref 0.2–1.2)
TOTAL PROTEIN: 6.6 g/dL (ref 6.1–8.1)
Total Bilirubin: 1 mg/dL (ref 0.2–1.2)

## 2017-07-11 ENCOUNTER — Telehealth: Payer: Self-pay | Admitting: Gastroenterology

## 2017-07-11 NOTE — Telephone Encounter (Signed)
Pt called to follow up on her FMLA papers. She brought them in right after Christmas. Erline Levine said she laid them in your office chair. I can't find them. Do you still have them with you? Pt said she would bring another copy by on Friday morning just in case.

## 2017-07-11 NOTE — Telephone Encounter (Signed)
I have them. Please let her know I was only here 2 days last week, so I'm catching up. I need to know exactly what date she wants the beginning of disability to be and the return to work date.

## 2017-07-12 ENCOUNTER — Encounter (HOSPITAL_COMMUNITY): Payer: Self-pay

## 2017-07-12 ENCOUNTER — Encounter: Payer: Self-pay | Admitting: Gastroenterology

## 2017-07-12 ENCOUNTER — Telehealth: Payer: Self-pay | Admitting: Gastroenterology

## 2017-07-12 NOTE — Telephone Encounter (Signed)
Note is complete and in epic.

## 2017-07-12 NOTE — Patient Instructions (Signed)
Your procedure is scheduled on: Wednesday, Jan. 9, 2019   Surgery Time:  1:45PM-2:45PM   Report to [facility] Main  Entrance    Report to admitting at 11:45AM   Call this number if you have problems the morning of surgery [phone]   Do not eat food or drink liquids :After Midnight.   Do NOT smoke after Midnight   Take these medicines the morning of surgery with A SIP OF WATER: Omeprazole                               You may not have any metal on your body including hair pins, jewelry, and body piercings             Do not wear make-up, lotions, powders, perfumes/cologne, or deodorant             Do not wear nail polish.  Do not shave  48 hours prior to surgery.                 Do not bring valuables to the hospital. [place].   Contacts, dentures or bridgework may not be worn into surgery.   Patients discharged the day of surgery will not be allowed to drive home.   Name and phone number of your driver:   Special Instructions: Bring a copy of your healthcare power of attorney and living will documents         the day of surgery if you haven't scanned them in before.              Please read over the following fact sheets you were given:   [facility] - Preparing for Surgery Before surgery, you can play an important role.  Because skin is not sterile, your skin needs to be as free of germs as possible.  You can reduce the number of germs on your skin by washing with CHG (chlorahexidine gluconate) soap before surgery.  CHG is an antiseptic cleaner which kills germs and bonds with the skin to continue killing germs even after washing. Please DO NOT use if you have an allergy to CHG or antibacterial soaps.  If your skin becomes reddened/irritated stop using the CHG and inform your nurse when you arrive at Short Stay. Do not shave (including legs and underarms) for at least 48 hours prior to the first CHG shower.   You may shave your face/neck.  Please follow these instructions carefully:  1.  Shower with CHG Soap the night before surgery and the  morning of surgery.  2.  If you choose to wash your hair, wash your hair first as usual with your normal  shampoo.  3.  After you shampoo, rinse your hair and body thoroughly to remove the shampoo.                             4.  Use CHG as you would any other liquid soap.  You can apply chg directly to the skin and wash.  Gently with a scrungie or clean washcloth.  5.  Apply the CHG Soap to your body ONLY FROM THE NECK DOWN.   Do   not use on face/ open  Wound or open sores. Avoid contact with eyes, ears mouth and   genitals (private parts).                       Wash face,  Genitals (private parts) with your normal soap.             6.  Wash thoroughly, paying special attention to the area where your    surgery  will be performed.  7.  Thoroughly rinse your body with warm water from the neck down.  8.  DO NOT shower/wash with your normal soap after using and rinsing off the CHG Soap.                9.  Pat yourself dry with a clean towel.            10.  Wear clean pajamas.            11.  Place clean sheets on your bed the night of your first shower and do not  sleep with pets. Day of Surgery : Do not apply any lotions/deodorants the morning of surgery.  Please wear clean clothes to the hospital/surgery center.  FAILURE TO FOLLOW THESE INSTRUCTIONS MAY RESULT IN THE CANCELLATION OF YOUR SURGERY  PATIENT SIGNATURE_________________________________  NURSE SIGNATURE__________________________________  ________________________________________________________________________

## 2017-07-12 NOTE — Telephone Encounter (Signed)
Completed.

## 2017-07-12 NOTE — Telephone Encounter (Signed)
I spoke with patient and she said the beginning date for disability started on 06/01/2017 and return to work on 07/18/2017. Patient said she was having surgery on 07/18/2017 and her surgeon would probably keep her out at least a week. She is aware of the $29 fee and that I would fax forms once that's paid. She agreed.

## 2017-07-12 NOTE — Telephone Encounter (Signed)
Pt called to let AB know that her surgery date has been changed from 1/9 to 07/20/2017

## 2017-07-16 ENCOUNTER — Encounter (HOSPITAL_COMMUNITY)
Admission: RE | Admit: 2017-07-16 | Discharge: 2017-07-16 | Disposition: A | Discharge status: Home / Self Care | Payer: PRIVATE HEALTH INSURANCE | Source: Ambulatory Visit | Attending: Family Medicine | Admitting: Family Medicine

## 2017-07-16 HISTORY — DX: Unspecified jaundice: R17

## 2017-07-16 HISTORY — DX: Headache: R51

## 2017-07-16 HISTORY — DX: Umbilical hernia without obstruction or gangrene: K42.9

## 2017-07-16 HISTORY — DX: Calculus of gallbladder without cholecystitis without obstruction: K80.20

## 2017-07-16 HISTORY — DX: Headache, unspecified: R51.9

## 2017-07-16 HISTORY — DX: Nonspecific elevation of levels of transaminase and lactic acid dehydrogenase (ldh): R74.0

## 2017-07-16 HISTORY — DX: Abnormal levels of other serum enzymes: R74.8

## 2017-07-16 HISTORY — DX: Gastro-esophageal reflux disease without esophagitis: K21.9

## 2017-07-17 NOTE — Pre-Procedure Instructions (Signed)
Angel Humphrey  07/17/2017      [address], [place] [address] Phone: [phone] Fax: [phone]    Your procedure is scheduled on July 20, 2017.  Report to [facility] Admitting at 145 PM.  Call this number if you have problems the morning of surgery:  [phone]   Remember:  Do not eat food or drink liquids after midnight.  Take these medicines the morning of surgery with A SIP OF WATER benadryl-if needed, norethindrone, omeprazole (prilosec), ondansetron (zofran)-if needed for nausea, promethazine (phenergan).  Beginning now, STOP taking any Aspirin (unless otherwise instructed by your surgeon), Aleve, Naproxen, Ibuprofen, Motrin, Advil, Goody's, BC's, all herbal medications, fish oil, and all vitamins  Continue all other medications as instructed by your physician except follow the above medication instructions before surgery   Do not wear jewelry, make-up or nail polish.  Do not wear lotions, powders, or perfumes, or deodorant.  Do not shave 48 hours prior to surgery.    Do not bring valuables to the hospital.  [facility] is not responsible for any belongings or valuables.  Contacts, dentures or bridgework may not be worn into surgery.  Leave your suitcase in the car.  After surgery it may be brought to your room.  For patients admitted to the hospital, discharge time will be determined by your treatment team.  Patients discharged the day of surgery will not be allowed to drive home.   Special instructions:   [facility]- Preparing For Surgery  Before surgery, you can play an important role. Because skin is not sterile, your skin needs to be as free of germs as possible. You can reduce the number of germs on your skin by washing with CHG (chlorahexidine gluconate) Soap before surgery.  CHG is an antiseptic cleaner which kills germs and bonds with the skin to continue killing germs  even after washing.  Please do not use if you have an allergy to CHG or antibacterial soaps. If your skin becomes reddened/irritated stop using the CHG.  Do not shave (including legs and underarms) for at least 48 hours prior to first CHG shower. It is OK to shave your face.  Please follow these instructions carefully.   1. Shower the NIGHT BEFORE SURGERY and the MORNING OF SURGERY with CHG.   2. If you chose to wash your hair, wash your hair first as usual with your normal shampoo.  3. After you shampoo, rinse your hair and body thoroughly to remove the shampoo.  4. Use CHG as you would any other liquid soap. You can apply CHG directly to the skin and wash gently with a scrungie or a clean washcloth.   5. Apply the CHG Soap to your body ONLY FROM THE NECK DOWN.  Do not use on open wounds or open sores. Avoid contact with your eyes, ears, mouth and genitals (private parts). Wash Face and genitals (private parts)  with your normal soap.  6. Wash thoroughly, paying special attention to the area where your surgery will be performed.  7. Thoroughly rinse your body with warm water from the neck down.  8. DO NOT shower/wash with your normal soap after using and rinsing off the CHG Soap.  9. Pat yourself dry with a CLEAN TOWEL.  10. Wear CLEAN PAJAMAS to bed the night before surgery, wear comfortable clothes the morning of surgery  11. Place CLEAN SHEETS on your bed  the night of your first shower and DO NOT SLEEP WITH PETS.  Day of Surgery: Do not apply any deodorants/lotions. Please wear clean clothes to the hospital/surgery center.    Please read over the following fact sheets that you were given. Pain Booklet, Coughing and Deep Breathing and Surgical Site Infection Prevention

## 2017-07-18 ENCOUNTER — Other Ambulatory Visit: Payer: Self-pay

## 2017-07-18 ENCOUNTER — Encounter (HOSPITAL_COMMUNITY): Payer: Self-pay

## 2017-07-18 ENCOUNTER — Encounter (HOSPITAL_COMMUNITY)
Admission: RE | Admit: 2017-07-18 | Discharge: 2017-07-18 | Disposition: A | Discharge status: Home / Self Care | Payer: PRIVATE HEALTH INSURANCE | Source: Ambulatory Visit | Attending: General Surgery | Admitting: General Surgery

## 2017-07-18 DIAGNOSIS — K811 Chronic cholecystitis: Secondary | ICD-10-CM | POA: Diagnosis not present

## 2017-07-18 DIAGNOSIS — I10 Essential (primary) hypertension: Secondary | ICD-10-CM | POA: Diagnosis not present

## 2017-07-18 DIAGNOSIS — K8020 Calculus of gallbladder without cholecystitis without obstruction: Secondary | ICD-10-CM | POA: Diagnosis present

## 2017-07-18 DIAGNOSIS — Z793 Long term (current) use of hormonal contraceptives: Secondary | ICD-10-CM | POA: Diagnosis not present

## 2017-07-18 DIAGNOSIS — Z79899 Other long term (current) drug therapy: Secondary | ICD-10-CM | POA: Diagnosis not present

## 2017-07-18 DIAGNOSIS — K219 Gastro-esophageal reflux disease without esophagitis: Secondary | ICD-10-CM | POA: Diagnosis not present

## 2017-07-18 LAB — CBC
HEMATOCRIT: 42.8 % (ref 36.0–46.0)
HEMOGLOBIN: 13.7 g/dL (ref 12.0–15.0)
MCH: 28.1 pg (ref 26.0–34.0)
MCHC: 32 g/dL (ref 30.0–36.0)
MCV: 87.7 fL (ref 78.0–100.0)
Platelets: 357 10*3/uL (ref 150–400)
RBC: 4.88 MIL/uL (ref 3.87–5.11)
RDW: 13.1 % (ref 11.5–15.5)
WBC: 10.3 10*3/uL (ref 4.0–10.5)

## 2017-07-18 LAB — COMPREHENSIVE METABOLIC PANEL
ALBUMIN: 3.9 g/dL (ref 3.5–5.0)
ALT: 52 U/L (ref 14–54)
AST: 36 U/L (ref 15–41)
Alkaline Phosphatase: 175 U/L — ABNORMAL HIGH (ref 38–126)
Anion gap: 9 (ref 5–15)
BILIRUBIN TOTAL: 1.2 mg/dL (ref 0.3–1.2)
BUN: 8 mg/dL (ref 6–20)
CHLORIDE: 103 mmol/L (ref 101–111)
CO2: 27 mmol/L (ref 22–32)
Calcium: 9.5 mg/dL (ref 8.9–10.3)
Creatinine, Ser: 0.65 mg/dL (ref 0.44–1.00)
GFR calc Af Amer: >60 mL/min (ref >60)
GFR calc non Af Amer: >60 mL/min (ref >60)
GLUCOSE: 82 mg/dL (ref 65–99)
POTASSIUM: 3.5 mmol/L (ref 3.5–5.1)
SODIUM: 139 mmol/L (ref 135–145)
TOTAL PROTEIN: 7.7 g/dL (ref 6.5–8.1)

## 2017-07-18 LAB — HCG, SERUM, QUALITATIVE: Preg, Serum: NEGATIVE

## 2017-07-18 NOTE — Progress Notes (Signed)
PCP: Dr. Margo Aye  EKG: Today ECHO: Denies Stress Test: Denies Cardiac Cath: Denies  Patient denies shortness of breath, fever, cough, and chest pain at PAT appointment.  Patient verbalized understanding of instructions provided today at the PAT appointment.  Patient asked to review instructions at home and day of surgery.

## 2017-07-20 ENCOUNTER — Ambulatory Visit (HOSPITAL_COMMUNITY): Payer: PRIVATE HEALTH INSURANCE | Admitting: Certified Registered Nurse Anesthetist

## 2017-07-20 ENCOUNTER — Ambulatory Visit (HOSPITAL_COMMUNITY)
Admission: RE | Admit: 2017-07-20 | Discharge: 2017-07-20 | Disposition: A | Discharge status: Home / Self Care | Payer: PRIVATE HEALTH INSURANCE | Source: Ambulatory Visit | Attending: General Surgery | Admitting: General Surgery

## 2017-07-20 ENCOUNTER — Encounter (HOSPITAL_COMMUNITY): Payer: Self-pay | Admitting: Certified Registered Nurse Anesthetist

## 2017-07-20 ENCOUNTER — Encounter (HOSPITAL_COMMUNITY)
Admission: RE | Disposition: A | Discharge status: Home / Self Care | Payer: Self-pay | Source: Ambulatory Visit | Attending: General Surgery

## 2017-07-20 DIAGNOSIS — Z793 Long term (current) use of hormonal contraceptives: Secondary | ICD-10-CM | POA: Insufficient documentation

## 2017-07-20 DIAGNOSIS — I10 Essential (primary) hypertension: Secondary | ICD-10-CM | POA: Insufficient documentation

## 2017-07-20 DIAGNOSIS — K219 Gastro-esophageal reflux disease without esophagitis: Secondary | ICD-10-CM | POA: Insufficient documentation

## 2017-07-20 DIAGNOSIS — K811 Chronic cholecystitis: Secondary | ICD-10-CM | POA: Insufficient documentation

## 2017-07-20 DIAGNOSIS — Z79899 Other long term (current) drug therapy: Secondary | ICD-10-CM | POA: Insufficient documentation

## 2017-07-20 HISTORY — PX: CHOLECYSTECTOMY: SHX55

## 2017-07-20 SURGERY — LAPAROSCOPIC CHOLECYSTECTOMY
Anesthesia: General

## 2017-07-20 MED ORDER — FENTANYL CITRATE (PF) 250 MCG/5ML IJ SOLN
INTRAMUSCULAR | Status: AC
  Filled 2017-07-20: qty 5

## 2017-07-20 MED ORDER — SODIUM CHLORIDE 0.9% FLUSH: 3.0000 mL | INTRAVENOUS | Status: DC | PRN

## 2017-07-20 MED ORDER — CHLORHEXIDINE GLUCONATE CLOTH 2 % EX PADS: 6.0000 | MEDICATED_PAD | Freq: Once | CUTANEOUS | Status: DC

## 2017-07-20 MED ORDER — OXYCODONE-ACETAMINOPHEN 5-325 MG PO TABS: 1.0000 | ORAL_TABLET | Freq: Four times a day (QID) | ORAL | 0 refills | Status: AC | PRN

## 2017-07-20 MED ORDER — SUGAMMADEX SODIUM 200 MG/2ML IV SOLN
INTRAVENOUS | Status: DC | PRN
  Administered 2017-07-20: 170 mg via INTRAVENOUS

## 2017-07-20 MED ORDER — PROPOFOL 10 MG/ML IV BOLUS
INTRAVENOUS | Status: DC | PRN
  Administered 2017-07-20: 150 mg via INTRAVENOUS

## 2017-07-20 MED ORDER — FENTANYL CITRATE (PF) 100 MCG/2ML IJ SOLN
INTRAMUSCULAR | Status: DC | PRN
  Administered 2017-07-20 (×2): 50 ug via INTRAVENOUS
  Administered 2017-07-20: 100 ug via INTRAVENOUS
  Administered 2017-07-20: 50 ug via INTRAVENOUS

## 2017-07-20 MED ORDER — OXYCODONE HCL 5 MG/5ML PO SOLN: 5.0000 mg | Freq: Once | ORAL | Status: DC | PRN

## 2017-07-20 MED ORDER — MORPHINE SULFATE (PF) 2 MG/ML IV SOLN: 2.0000 mg | INTRAVENOUS | Status: DC | PRN

## 2017-07-20 MED ORDER — OXYCODONE HCL 5 MG PO TABS: 5.0000 mg | ORAL_TABLET | Freq: Once | ORAL | Status: DC | PRN

## 2017-07-20 MED ORDER — ONDANSETRON HCL 4 MG/2ML IJ SOLN
INTRAMUSCULAR | Status: DC | PRN
  Administered 2017-07-20: 4 mg via INTRAVENOUS

## 2017-07-20 MED ORDER — PROMETHAZINE HCL 25 MG/ML IJ SOLN
INTRAMUSCULAR | Status: AC
  Filled 2017-07-20: qty 1

## 2017-07-20 MED ORDER — HYDROMORPHONE HCL 1 MG/ML IJ SOLN
INTRAMUSCULAR | Status: AC
  Filled 2017-07-20: qty 1

## 2017-07-20 MED ORDER — 0.9 % SODIUM CHLORIDE (POUR BTL) OPTIME
TOPICAL | Status: DC | PRN
  Administered 2017-07-20: 1000 mL

## 2017-07-20 MED ORDER — SODIUM CHLORIDE 0.9 % IR SOLN
Status: DC | PRN
  Administered 2017-07-20: 1000 mL

## 2017-07-20 MED ORDER — OXYCODONE HCL 5 MG PO TABS
ORAL_TABLET | ORAL | Status: AC
  Filled 2017-07-20: qty 2

## 2017-07-20 MED ORDER — ACETAMINOPHEN 650 MG RE SUPP: 650.0000 mg | RECTAL | Status: DC | PRN

## 2017-07-20 MED ORDER — ACETAMINOPHEN 325 MG PO TABS: 650.0000 mg | ORAL_TABLET | ORAL | Status: DC | PRN

## 2017-07-20 MED ORDER — OXYCODONE HCL 5 MG PO TABS
5.0000 mg | ORAL_TABLET | ORAL | Status: DC | PRN
  Administered 2017-07-20: 10 mg via ORAL

## 2017-07-20 MED ORDER — ONDANSETRON HCL 4 MG/2ML IJ SOLN
INTRAMUSCULAR | Status: AC
  Filled 2017-07-20: qty 2

## 2017-07-20 MED ORDER — SODIUM CHLORIDE 0.9% FLUSH: 3.0000 mL | Freq: Two times a day (BID) | INTRAVENOUS | Status: DC

## 2017-07-20 MED ORDER — PROMETHAZINE HCL 25 MG/ML IJ SOLN
6.2500 mg | INTRAMUSCULAR | Status: DC | PRN
  Administered 2017-07-20: 6.25 mg via INTRAVENOUS

## 2017-07-20 MED ORDER — LIDOCAINE 2% (20 MG/ML) 5 ML SYRINGE
INTRAMUSCULAR | Status: DC | PRN
  Administered 2017-07-20: 10 mg via INTRAVENOUS

## 2017-07-20 MED ORDER — MEPERIDINE HCL 25 MG/ML IJ SOLN: 6.2500 mg | INTRAMUSCULAR | Status: DC | PRN

## 2017-07-20 MED ORDER — PROPOFOL 10 MG/ML IV BOLUS
INTRAVENOUS | Status: AC
  Filled 2017-07-20: qty 20

## 2017-07-20 MED ORDER — HYDROMORPHONE HCL 1 MG/ML IJ SOLN
0.2500 mg | INTRAMUSCULAR | Status: DC | PRN
  Administered 2017-07-20 (×2): 0.5 mg via INTRAVENOUS

## 2017-07-20 MED ORDER — SODIUM CHLORIDE 0.9 % IV SOLN: 250.0000 mL | INTRAVENOUS | Status: DC | PRN

## 2017-07-20 MED ORDER — MIDAZOLAM HCL 5 MG/5ML IJ SOLN
INTRAMUSCULAR | Status: DC | PRN
  Administered 2017-07-20: 2 mg via INTRAVENOUS

## 2017-07-20 MED ORDER — BUPIVACAINE HCL 0.25 % IJ SOLN
INTRAMUSCULAR | Status: DC | PRN
  Administered 2017-07-20: 5 mL

## 2017-07-20 MED ORDER — CEFAZOLIN SODIUM-DEXTROSE 2-4 GM/100ML-% IV SOLN
2.0000 g | INTRAVENOUS | Status: AC
  Administered 2017-07-20: 2 g via INTRAVENOUS
  Filled 2017-07-20: qty 100

## 2017-07-20 MED ORDER — ROCURONIUM BROMIDE 10 MG/ML (PF) SYRINGE
PREFILLED_SYRINGE | INTRAVENOUS | Status: DC | PRN
  Administered 2017-07-20: 40 mg via INTRAVENOUS

## 2017-07-20 MED ORDER — DEXAMETHASONE SODIUM PHOSPHATE 10 MG/ML IJ SOLN
INTRAMUSCULAR | Status: DC | PRN
  Administered 2017-07-20: 10 mg via INTRAVENOUS

## 2017-07-20 MED ORDER — LACTATED RINGERS IV SOLN
INTRAVENOUS | Status: DC
  Administered 2017-07-20 (×2): via INTRAVENOUS

## 2017-07-20 MED ORDER — MIDAZOLAM HCL 2 MG/2ML IJ SOLN
INTRAMUSCULAR | Status: AC
  Filled 2017-07-20: qty 2

## 2017-07-20 MED ORDER — SUGAMMADEX SODIUM 200 MG/2ML IV SOLN
INTRAVENOUS | Status: AC
  Filled 2017-07-20: qty 2

## 2017-07-20 SURGICAL SUPPLY — 43 items
BENZOIN TINCTURE PRP APPL 2/3 (GAUZE/BANDAGES/DRESSINGS) ×3 IMPLANT
CANISTER SUCT 3000ML PPV (MISCELLANEOUS) ×3 IMPLANT
CHLORAPREP W/TINT 26ML (MISCELLANEOUS) ×3 IMPLANT
CLIP VESOLOCK MED LG 6/CT (CLIP) ×3 IMPLANT
CLOSURE WOUND 1/2 X4 (GAUZE/BANDAGES/DRESSINGS) ×1
COVER SURGICAL LIGHT HANDLE (MISCELLANEOUS) ×3 IMPLANT
COVER TRANSDUCER ULTRASND (DRAPES) ×3 IMPLANT
ELECT REM PT RETURN 9FT ADLT (ELECTROSURGICAL) ×3
ELECTRODE REM PT RTRN 9FT ADLT (ELECTROSURGICAL) ×1 IMPLANT
GAUZE SPONGE 2X2 8PLY STRL LF (GAUZE/BANDAGES/DRESSINGS) ×1 IMPLANT
GLOVE BIO SURGEON STRL SZ7.5 (GLOVE) ×3 IMPLANT
GLOVE BIOGEL PI IND STRL 6 (GLOVE) ×1 IMPLANT
GLOVE BIOGEL PI IND STRL 6.5 (GLOVE) ×1 IMPLANT
GLOVE BIOGEL PI INDICATOR 6 (GLOVE) ×2
GLOVE BIOGEL PI INDICATOR 6.5 (GLOVE) ×2
GLOVE SURG SS PI 6.5 STRL IVOR (GLOVE) ×3 IMPLANT
GOWN STRL NON-REIN LRG LVL3 (GOWN DISPOSABLE) ×3 IMPLANT
GOWN STRL REUS W/ TWL LRG LVL3 (GOWN DISPOSABLE) ×2 IMPLANT
GOWN STRL REUS W/ TWL XL LVL3 (GOWN DISPOSABLE) ×1 IMPLANT
GOWN STRL REUS W/TWL LRG LVL3 (GOWN DISPOSABLE) ×4
GOWN STRL REUS W/TWL XL LVL3 (GOWN DISPOSABLE) ×2
GRASPER SUT TROCAR 14GX15 (MISCELLANEOUS) ×3 IMPLANT
KIT BASIN OR (CUSTOM PROCEDURE TRAY) ×3 IMPLANT
KIT ROOM TURNOVER OR (KITS) ×3 IMPLANT
NEEDLE INSUFFLATION 14GA 120MM (NEEDLE) ×3 IMPLANT
NS IRRIG 1000ML POUR BTL (IV SOLUTION) ×3 IMPLANT
PAD ARMBOARD 7.5X6 YLW CONV (MISCELLANEOUS) ×6 IMPLANT
POUCH RETRIEVAL ECOSAC 10 (ENDOMECHANICALS) IMPLANT
POUCH RETRIEVAL ECOSAC 10MM (ENDOMECHANICALS)
SCISSORS LAP 5X35 DISP (ENDOMECHANICALS) ×3 IMPLANT
SET IRRIG TUBING LAPAROSCOPIC (IRRIGATION / IRRIGATOR) ×3 IMPLANT
SLEEVE ENDOPATH XCEL 5M (ENDOMECHANICALS) ×3 IMPLANT
SPECIMEN JAR SMALL (MISCELLANEOUS) ×3 IMPLANT
SPONGE GAUZE 2X2 STER 10/PKG (GAUZE/BANDAGES/DRESSINGS) ×2
STRIP CLOSURE SKIN 1/2X4 (GAUZE/BANDAGES/DRESSINGS) ×2 IMPLANT
SUT MNCRL AB 3-0 PS2 18 (SUTURE) ×3 IMPLANT
TOWEL OR 17X24 6PK STRL BLUE (TOWEL DISPOSABLE) ×3 IMPLANT
TOWEL OR 17X26 10 PK STRL BLUE (TOWEL DISPOSABLE) IMPLANT
TRAY LAPAROSCOPIC MC (CUSTOM PROCEDURE TRAY) ×3 IMPLANT
TROCAR XCEL NON-BLD 11X100MML (ENDOMECHANICALS) ×3 IMPLANT
TROCAR XCEL NON-BLD 5MMX100MML (ENDOMECHANICALS) ×3 IMPLANT
TUBING INSUFFLATION (TUBING) ×3 IMPLANT
WATER STERILE IRR 1000ML POUR (IV SOLUTION) ×3 IMPLANT

## 2017-07-20 NOTE — Anesthesia Preprocedure Evaluation (Addendum)
Anesthesia Evaluation  Patient identified by MRN, date of birth, ID band Patient awake    Reviewed: Allergy & Precautions, NPO status , Patient's Chart, lab work & pertinent test results  Airway Mallampati: II  TM Distance: >3 FB Neck ROM: Full    Dental no notable dental hx.    Pulmonary neg pulmonary ROS,    Pulmonary exam normal breath sounds clear to auscultation       Cardiovascular hypertension, Normal cardiovascular exam Rhythm:Regular Rate:Normal     Neuro/Psych  Headaches, negative psych ROS   GI/Hepatic Neg liver ROS, GERD  ,  Endo/Other  negative endocrine ROS  Renal/GU negative Renal ROS     Musculoskeletal negative musculoskeletal ROS (+)   Abdominal   Peds  Hematology negative hematology ROS (+)   Anesthesia Other Findings   Reproductive/Obstetrics                            Anesthesia Physical Anesthesia Plan  ASA: III  Anesthesia Plan: General   Post-op Pain Management:    Induction: Intravenous  PONV Risk Score and Plan: 4 or greater and Ondansetron, Dexamethasone and Midazolam  Airway Management Planned: Oral ETT  Additional Equipment:   Intra-op Plan:   Post-operative Plan: Extubation in OR  Informed Consent: I have reviewed the patients History and Physical, chart, labs and discussed the procedure including the risks, benefits and alternatives for the proposed anesthesia with the patient or authorized representative who has indicated his/her understanding and acceptance.   Dental advisory given  Plan Discussed with: CRNA  Anesthesia Plan Comments:        Anesthesia Quick Evaluation

## 2017-07-20 NOTE — Discharge Instructions (Signed)
[facility] ______[facility] °LAPAROSCOPIC SURGERY: POST OP INSTRUCTIONS °Always review your discharge instruction sheet given to you by the facility where your surgery was performed. °IF YOU HAVE DISABILITY OR FAMILY LEAVE FORMS, YOU MUST BRING THEM TO THE OFFICE FOR PROCESSING.   °DO NOT GIVE THEM TO YOUR DOCTOR. ° °1. A prescription for pain medication may be given to you upon discharge.  Take your pain medication as prescribed, if needed.  If narcotic pain medicine is not needed, then you may take acetaminophen (Tylenol) or ibuprofen (Advil) as needed. °2. Take your usually prescribed medications unless otherwise directed. °3. If you need a refill on your pain medication, please contact your pharmacy.  They will contact our office to request authorization. Prescriptions will not be filled after 5pm or on week-ends. °4. You should follow a light diet the first few days after arrival home, such as soup and crackers, etc.  Be sure to include lots of fluids daily. °5. Most patients will experience some swelling and bruising in the area of the incisions.  Ice packs will help.  Swelling and bruising can take several days to resolve.  °6. It is common to experience some constipation if taking pain medication after surgery.  Increasing fluid intake and taking a stool softener (such as Colace) will usually help or prevent this problem from occurring.  A mild laxative (Milk of Magnesia or Miralax) should be taken according to package instructions if there are no bowel movements after 48 hours. °7. Unless discharge instructions indicate otherwise, you may remove your bandages 24-48 hours after surgery, and you may shower at that time.  You may have steri-strips (small skin tapes) in place directly over the incision.  These strips should be left on the skin for 7-10 days.  If your surgeon used skin glue on the incision, you may shower in 24 hours.  The glue will flake off over the next 2-3 weeks.  Any sutures or  staples will be removed at the office during your follow-up visit. °8. ACTIVITIES:  You may resume regular (light) daily activities beginning the next day--such as daily self-care, walking, climbing stairs--gradually increasing activities as tolerated.  You may have sexual intercourse when it is comfortable.  Refrain from any heavy lifting or straining until approved by your doctor. °a. You may drive when you are no longer taking prescription pain medication, you can comfortably wear a seatbelt, and you can safely maneuver your car and apply brakes. °b. RETURN TO WORK:  __________________________________________________________ °9. You should see your doctor in the office for a follow-up appointment approximately 2-3 weeks after your surgery.  Make sure that you call for this appointment within a day or two after you arrive home to insure a convenient appointment time. °10. OTHER INSTRUCTIONS: __________________________________________________________________________________________________________________________ __________________________________________________________________________________________________________________________ °WHEN TO CALL YOUR DOCTOR: °1. Fever over 101.0 °2. Inability to urinate °3. Continued bleeding from incision. °4. Increased pain, redness, or drainage from the incision. °5. Increasing abdominal pain ° °The clinic staff is available to answer your questions during regular business hours.  Please don’t hesitate to call and ask to speak to one of the nurses for clinical concerns.  If you have a medical emergency, go to the nearest emergency room or call 911.  A surgeon from [facility] is always on call at the hospital. °[address], [address], [place], [place]  [address] ? [address] [address], [place], [place]   [address] °[phone] ? [phone] ? FAX [phone] °Web site:   [email or web address] °

## 2017-07-20 NOTE — Op Note (Signed)
07/20/2017  3:25 PM  PATIENT:  Angel Humphrey  47 y.o. female  PRE-OPERATIVE DIAGNOSIS:  gallstones  POST-OPERATIVE DIAGNOSIS:  gallstones  PROCEDURE:  Procedure(s): LAPAROSCOPIC CHOLECYSTECTOMY (N/A)  SURGEON:  Surgeon(s) and Role:    Ralene Ok, MD - Primary  ANESTHESIA:   local and general  EBL:  5 mL   BLOOD ADMINISTERED:none  DRAINS: none   LOCAL MEDICATIONS USED:  BUPIVICAINE   SPECIMEN:  Source of Specimen:  gallbladder  DISPOSITION OF SPECIMEN:  PATHOLOGY  COUNTS:  YES  TOURNIQUET:  * No tourniquets in log *  DICTATION: .Dragon Dictation  EBL: <2LM   Complications: none   Counts: reported as correct x 2    Indications for procedure: Pt is a 7 F with RUQ pain and seen to have gallstones and a h/o jaundice.  Details of the procedure: The patient was taken to the operating and placed in the supine position with bilateral SCDs in place. A time out was called and all facts were verified. A pneumoperitoneum was obtained via A Veress needle technique to a pressure of 5mm of mercury. A 6mm trochar was then placed in the right upper quadrant under visualization, and there were no injuries to any abdominal organs. A 11 mm port was then placed in the umbilical region after infiltrating with local anesthesia under direct visualization. A second epigastric port was placed under direct visualization.   The gallbladder was identified and retracted, the peritoneum was then sharply dissected from the gallbladder and this dissection was carried down to Calot's triangle. The cystic duct was identified and dissected circumferentially and seen going into the gallbladder 360.  The cystic artery was dissected away from the surrounding tissues.   The critical angle was obtained.  2 clips were placed proximally one distally and the cystic duct transected. The cystic artery was identified and 2 clips placed proximally and one distally and transected. We then proceeded to  remove the gallbladder off the hepatic fossa with Bovie cautery. A retrieval bag was then placed in the abdomen and gallbladder placed in the bag. The hepatic fossa was then reexamined and hemostasis was achieved with Bovie cautery and was excellent at this portion of the case. The subhepatic fossa and perihepatic fossa was then irrigated until the effluent was clear. The specimen bag and specimen were removed from the abdominal cavity.  The 11 mm trocar fascia was reapproximated with the Endo Close #1 Vicryl x1. The pneumoperitoneum was evacuated and all trochars removed under direct visulalization. The skin was then closed with 4-0 Monocryl and the skin dressed with Steri-Strips, gauze, and tape. The patient was awaken from general anesthesia and taken to the recovery room in stable condition.    PLAN OF CARE: Discharge to home after PACU  PATIENT DISPOSITION:  PACU - hemodynamically stable.   Delay start of Pharmacological VTE agent (>24hrs) due to surgical blood loss or risk of bleeding: not applicable

## 2017-07-20 NOTE — Anesthesia Procedure Notes (Signed)
Procedure Name: Intubation Date/Time: 07/20/2017 2:48 PM Performed by: Harden Mo, CRNA Pre-anesthesia Checklist: Patient identified, Emergency Drugs available, Suction available and Patient being monitored Patient Re-evaluated:Patient Re-evaluated prior to induction Oxygen Delivery Method: Circle System Utilized Preoxygenation: Pre-oxygenation with 100% oxygen Induction Type: IV induction Ventilation: Mask ventilation without difficulty Laryngoscope Size: Miller and 2 Grade View: Grade I Tube type: Oral Tube size: 7.0 mm Number of attempts: 1 Airway Equipment and Method: Stylet and Oral airway Placement Confirmation: ETT inserted through vocal cords under direct vision,  positive ETCO2 and breath sounds checked- equal and bilateral Secured at: 22 cm Tube secured with: Tape Dental Injury: Teeth and Oropharynx as per pre-operative assessment

## 2017-07-20 NOTE — Interval H&P Note (Signed)
History and Physical Interval Note:  07/20/2017 2:09 PM  Angel Humphrey  has presented today for surgery, with the diagnosis of gallstones  The various methods of treatment have been discussed with the patient and family. After consideration of risks, benefits and other options for treatment, the patient has consented to  Procedure(s): LAPAROSCOPIC CHOLECYSTECTOMY (N/A) as a surgical intervention .  The patient's history has been reviewed, patient examined, no change in status, stable for surgery.  I have reviewed the patient's chart and labs.  Questions were answered to the patient's satisfaction.     Rosario Jacks., Anne Hahn

## 2017-07-20 NOTE — Transfer of Care (Signed)
Immediate Anesthesia Transfer of Care Note  Patient: Angel Humphrey  Procedure(s) Performed: LAPAROSCOPIC CHOLECYSTECTOMY (N/A )  Patient Location: PACU  Anesthesia Type:General  Level of Consciousness: awake, alert  and oriented  Airway & Oxygen Therapy: Patient Spontanous Breathing and Patient connected to nasal cannula oxygen  Post-op Assessment: Report given to RN, Post -op Vital signs reviewed and stable and Patient moving all extremities X 4  Post vital signs: Reviewed and stable  Last Vitals:  Vitals:   07/20/17 1353  BP: 140/82  Pulse: 85  Resp: 20  Temp: 36.7 C  SpO2: 100%    Last Pain:  Vitals:   07/20/17 1405  TempSrc:   PainSc: 2          Complications: No apparent anesthesia complications

## 2017-07-21 NOTE — Anesthesia Postprocedure Evaluation (Signed)
Anesthesia Post Note  Patient: Angel Humphrey  Procedure(s) Performed: LAPAROSCOPIC CHOLECYSTECTOMY (N/A )     Patient location during evaluation: PACU Anesthesia Type: General Level of consciousness: sedated and patient cooperative Pain management: pain level controlled Vital Signs Assessment: post-procedure vital signs reviewed and stable Respiratory status: spontaneous breathing Cardiovascular status: stable Anesthetic complications: no    Last Vitals:  Vitals:   07/20/17 1638 07/20/17 1700  BP: 120/66 (!) 141/84  Pulse: 78 84  Resp: 20 18  Temp:    SpO2: 94% 96%    Last Pain:  Vitals:   07/20/17 1630  TempSrc:   PainSc: [place]

## 2017-07-22 ENCOUNTER — Encounter (HOSPITAL_COMMUNITY): Payer: Self-pay | Admitting: General Surgery

## 2017-07-23 NOTE — Progress Notes (Signed)
Lap chole recently completed. LFTs have almost nearly normalized.

## 2018-08-10 DIAGNOSIS — M1710 Unilateral primary osteoarthritis, unspecified knee: Secondary | ICD-10-CM

## 2018-08-10 DIAGNOSIS — S83289A Other tear of lateral meniscus, current injury, unspecified knee, initial encounter: Secondary | ICD-10-CM

## 2018-08-10 DIAGNOSIS — M7121 Synovial cyst of popliteal space [Baker], right knee: Secondary | ICD-10-CM

## 2018-08-10 DIAGNOSIS — M179 Osteoarthritis of knee, unspecified: Secondary | ICD-10-CM

## 2018-08-10 HISTORY — DX: Osteoarthritis of knee, unspecified: M17.9

## 2018-08-10 HISTORY — DX: Unilateral primary osteoarthritis, unspecified knee: M17.10

## 2018-08-10 HISTORY — DX: Synovial cyst of popliteal space (Baker), right knee: M71.21

## 2018-08-10 HISTORY — DX: Other tear of lateral meniscus, current injury, unspecified knee, initial encounter: S83.289A

## 2018-08-28 ENCOUNTER — Other Ambulatory Visit: Payer: Self-pay | Admitting: Sports Medicine

## 2018-08-28 DIAGNOSIS — M25561 Pain in right knee: Secondary | ICD-10-CM

## 2018-08-31 ENCOUNTER — Ambulatory Visit
Admission: RE | Admit: 2018-08-31 | Discharge: 2018-08-31 | Disposition: A | Discharge status: Home / Self Care | Payer: PRIVATE HEALTH INSURANCE | Source: Ambulatory Visit | Attending: Sports Medicine | Admitting: Sports Medicine

## 2018-08-31 DIAGNOSIS — M25561 Pain in right knee: Secondary | ICD-10-CM

## 2018-09-24 ENCOUNTER — Other Ambulatory Visit: Payer: Self-pay

## 2018-09-24 ENCOUNTER — Ambulatory Visit: Payer: Self-pay | Admitting: Physician Assistant

## 2018-09-24 ENCOUNTER — Encounter (HOSPITAL_BASED_OUTPATIENT_CLINIC_OR_DEPARTMENT_OTHER): Payer: Self-pay

## 2018-09-24 NOTE — H&P (View-Only) (Signed)
Angel Humphrey is an 48 y.o. female.   Chief Complaint: right knee pain HPI: She has a lateral tear with mechanical symptoms of catching and giving way.  Corticosteroid injection did not really help very long.  She has tried nonsteroidals without relief.    Past Medical History:  Diagnosis Date  . Baker's cyst of knee, right 08/2018  . Claustrophobia   . Diverticulosis   . Elevated liver enzymes   . Gallstones   . GERD (gastroesophageal reflux disease)   . Headache    Migraines  . Hypertension   . Interstitial cystitis   . Jaundice   . Lateral meniscal tear 08/2018   Right  . OA (osteoarthritis) of knee 08/2018   Right  . Periumbilical hernia 51/76/1607   small, per CT abdomen and pelvis  . Periumbilical hernia 37/1062  . PONV (postoperative nausea and vomiting)   . Pre-diabetes   . Transaminitis 06/12/2017   per Ultrasound  . Uterine fibroid 2018    Past Surgical History:  Procedure Laterality Date  . CESAREAN SECTION    . CHOLECYSTECTOMY N/A 07/20/2017   Procedure: LAPAROSCOPIC CHOLECYSTECTOMY;  Surgeon: Ralene Ok, MD;  Location: [facility];  Service: General;  Laterality: N/A;  . [place]  . FINGER SURGERY Left 11/2016   stitches  . LIVER BIOPSY  2018    Family History  Problem Relation Age of Onset  . Osteosarcoma Father   . Graves' disease Father    Social History:  reports that she has never smoked. She has never used smokeless tobacco. She reports that she does not drink alcohol or use drugs.  Allergies: No Known Allergies  (Not in a hospital admission)   No results found for this or any previous visit (from the past 48 hour(s)). No results found.  Review of Systems  Musculoskeletal: Positive for joint pain.  Neurological: Positive for headaches.  All other systems reviewed and are negative.   There were no vitals taken for this visit. Physical Exam  Constitutional: She is oriented to person, place, and time. She appears well-developed  and well-nourished.  HENT:  Head: Normocephalic and atraumatic.  Eyes: Conjunctivae and EOM are normal.  Neck: Normal range of motion. Neck supple.  Cardiovascular: Normal rate and intact distal pulses.  Respiratory: Effort normal. No respiratory distress.  GI: Soft. She exhibits no distension.  Musculoskeletal:     Right knee: She exhibits swelling. She exhibits no effusion and no erythema. Tenderness found. Lateral joint line tenderness noted.  Neurological: She is alert and oriented to person, place, and time.  Skin: Skin is warm and dry. No rash noted. No erythema.  Psychiatric: She has a normal mood and affect. Her behavior is normal.     Assessment/Plan She is between a rock and a hard place given her young age.  She is getting pretty significant wear in all three compartments, most noticeably laterally.  However, I think with the tear with persistent pain despite injection and over-the-counter anti-inflammatories that she is a candidate for right knee arthroscopy, lateral meniscectomy, chondroplasty, and possible microfracture on the lateral side of her knee.  Risks and benefits discussed in detail.  We will give her some pain medication that she can take at night; may not need a postop prescription in that regard. I will see her back potentially after surgery.  She mentioned to me that she could potentially do her therapy at [facility] even though she is in the [place] area.  Chriss Czar,  PA-C 09/24/2018, 5:39 PM

## 2018-09-24 NOTE — H&P (Signed)
Angel Humphrey is an 48 y.o. female.   Chief Complaint: right knee pain HPI: She has a lateral tear with mechanical symptoms of catching and giving way.  Corticosteroid injection did not really help very long.  She has tried nonsteroidals without relief.    Past Medical History:  Diagnosis Date  . Baker's cyst of knee, right 08/2018  . Claustrophobia   . Diverticulosis   . Elevated liver enzymes   . Gallstones   . GERD (gastroesophageal reflux disease)   . Headache    Migraines  . Hypertension   . Interstitial cystitis   . Jaundice   . Lateral meniscal tear 08/2018   Right  . OA (osteoarthritis) of knee 08/2018   Right  . Periumbilical hernia 57/32/2025   small, per CT abdomen and pelvis  . Periumbilical hernia 42/7062  . PONV (postoperative nausea and vomiting)   . Pre-diabetes   . Transaminitis 06/12/2017   per Ultrasound  . Uterine fibroid 2018    Past Surgical History:  Procedure Laterality Date  . CESAREAN SECTION    . CHOLECYSTECTOMY N/A 07/20/2017   Procedure: LAPAROSCOPIC CHOLECYSTECTOMY;  Surgeon: Ralene Ok, MD;  Location: [facility];  Service: General;  Laterality: N/A;  . [place]  . FINGER SURGERY Left 11/2016   stitches  . LIVER BIOPSY  2018    Family History  Problem Relation Age of Onset  . Osteosarcoma Father   . Graves' disease Father    Social History:  reports that she has never smoked. She has never used smokeless tobacco. She reports that she does not drink alcohol or use drugs.  Allergies: No Known Allergies  (Not in a hospital admission)   No results found for this or any previous visit (from the past 48 hour(s)). No results found.  Review of Systems  Musculoskeletal: Positive for joint pain.  Neurological: Positive for headaches.  All other systems reviewed and are negative.   There were no vitals taken for this visit. Physical Exam  Constitutional: She is oriented to person, place, and time. She appears well-developed  and well-nourished.  HENT:  Head: Normocephalic and atraumatic.  Eyes: Conjunctivae and EOM are normal.  Neck: Normal range of motion. Neck supple.  Cardiovascular: Normal rate and intact distal pulses.  Respiratory: Effort normal. No respiratory distress.  GI: Soft. She exhibits no distension.  Musculoskeletal:     Right knee: She exhibits swelling. She exhibits no effusion and no erythema. Tenderness found. Lateral joint line tenderness noted.  Neurological: She is alert and oriented to person, place, and time.  Skin: Skin is warm and dry. No rash noted. No erythema.  Psychiatric: She has a normal mood and affect. Her behavior is normal.     Assessment/Plan She is between a rock and a hard place given her young age.  She is getting pretty significant wear in all three compartments, most noticeably laterally.  However, I think with the tear with persistent pain despite injection and over-the-counter anti-inflammatories that she is a candidate for right knee arthroscopy, lateral meniscectomy, chondroplasty, and possible microfracture on the lateral side of her knee.  Risks and benefits discussed in detail.  We will give her some pain medication that she can take at night; may not need a postop prescription in that regard. I will see her back potentially after surgery.  She mentioned to me that she could potentially do her therapy at [place] even though she is in the [place] area.  Chriss Czar,  PA-C 09/24/2018, 5:39 PM

## 2018-09-24 NOTE — Progress Notes (Signed)
Spoke with:  Angel Humphrey NPO:  After Midnight, no gum, candy, or mints   Arrival time: 0915AM Labs: EKG, CMP AM medications: Omeprazole Pre op orders:  No Ride home:  Elta Guadeloupe (husband) [phone]

## 2018-09-25 ENCOUNTER — Ambulatory Visit (HOSPITAL_BASED_OUTPATIENT_CLINIC_OR_DEPARTMENT_OTHER): Payer: PRIVATE HEALTH INSURANCE | Admitting: Physician Assistant

## 2018-09-25 ENCOUNTER — Encounter (HOSPITAL_BASED_OUTPATIENT_CLINIC_OR_DEPARTMENT_OTHER): Payer: Self-pay | Admitting: *Deleted

## 2018-09-25 ENCOUNTER — Encounter (HOSPITAL_BASED_OUTPATIENT_CLINIC_OR_DEPARTMENT_OTHER)
Admission: RE | Disposition: A | Discharge status: Home / Self Care | Payer: Self-pay | Source: Home / Self Care | Attending: Orthopedic Surgery

## 2018-09-25 ENCOUNTER — Ambulatory Visit (HOSPITAL_BASED_OUTPATIENT_CLINIC_OR_DEPARTMENT_OTHER)
Admission: RE | Admit: 2018-09-25 | Discharge: 2018-09-25 | Disposition: A | Discharge status: Home / Self Care | Payer: PRIVATE HEALTH INSURANCE | Attending: Orthopedic Surgery | Admitting: Orthopedic Surgery

## 2018-09-25 ENCOUNTER — Other Ambulatory Visit: Payer: Self-pay

## 2018-09-25 DIAGNOSIS — K219 Gastro-esophageal reflux disease without esophagitis: Secondary | ICD-10-CM | POA: Diagnosis not present

## 2018-09-25 DIAGNOSIS — M94261 Chondromalacia, right knee: Secondary | ICD-10-CM | POA: Insufficient documentation

## 2018-09-25 DIAGNOSIS — S83271A Complex tear of lateral meniscus, current injury, right knee, initial encounter: Secondary | ICD-10-CM | POA: Insufficient documentation

## 2018-09-25 DIAGNOSIS — I10 Essential (primary) hypertension: Secondary | ICD-10-CM | POA: Diagnosis not present

## 2018-09-25 DIAGNOSIS — X58XXXA Exposure to other specified factors, initial encounter: Secondary | ICD-10-CM | POA: Diagnosis not present

## 2018-09-25 DIAGNOSIS — M25561 Pain in right knee: Secondary | ICD-10-CM | POA: Diagnosis present

## 2018-09-25 HISTORY — DX: Unilateral primary osteoarthritis, unspecified knee: M17.10

## 2018-09-25 HISTORY — DX: Other specified postprocedural states: Z98.890

## 2018-09-25 HISTORY — DX: Prediabetes: R73.03

## 2018-09-25 HISTORY — DX: Claustrophobia: F40.240

## 2018-09-25 HISTORY — DX: Nausea with vomiting, unspecified: R11.2

## 2018-09-25 HISTORY — DX: Synovial cyst of popliteal space (Baker), right knee: M71.21

## 2018-09-25 HISTORY — DX: Leiomyoma of uterus, unspecified: D25.9

## 2018-09-25 HISTORY — DX: Other tear of lateral meniscus, current injury, unspecified knee, initial encounter: S83.289A

## 2018-09-25 HISTORY — PX: KNEE ARTHROSCOPY WITH MEDIAL MENISECTOMY: SHX5651

## 2018-09-25 LAB — COMPREHENSIVE METABOLIC PANEL
ALT: 40 U/L (ref 0–44)
AST: 27 U/L (ref 15–41)
Albumin: 4.1 g/dL (ref 3.5–5.0)
Alkaline Phosphatase: 193 U/L — ABNORMAL HIGH (ref 38–126)
Anion gap: 8 (ref 5–15)
BUN: 11 mg/dL (ref 6–20)
CO2: 26 mmol/L (ref 22–32)
CREATININE: 0.61 mg/dL (ref 0.44–1.00)
Calcium: 9.4 mg/dL (ref 8.9–10.3)
Chloride: 107 mmol/L (ref 98–111)
GFR calc Af Amer: >60 mL/min (ref >60)
GFR calc non Af Amer: >60 mL/min (ref >60)
Glucose, Bld: 115 mg/dL — ABNORMAL HIGH (ref 70–99)
Potassium: 3.9 mmol/L (ref 3.5–5.1)
Sodium: 141 mmol/L (ref 135–145)
Total Bilirubin: 0.6 mg/dL (ref 0.3–1.2)
Total Protein: 7.8 g/dL (ref 6.5–8.1)

## 2018-09-25 LAB — POCT PREGNANCY, URINE: Preg Test, Ur: NEGATIVE

## 2018-09-25 SURGERY — ARTHROSCOPY, KNEE, WITH MEDIAL MENISCECTOMY
Anesthesia: General | Laterality: Right

## 2018-09-25 MED ORDER — ONDANSETRON HCL 4 MG/2ML IJ SOLN
4.0000 mg | Freq: Four times a day (QID) | INTRAMUSCULAR | Status: DC | PRN
  Filled 2018-09-25: qty 2

## 2018-09-25 MED ORDER — BUPIVACAINE-EPINEPHRINE 0.5% -1:200000 IJ SOLN
INTRAMUSCULAR | Status: DC | PRN
  Administered 2018-09-25: 30 mL

## 2018-09-25 MED ORDER — DEXAMETHASONE SODIUM PHOSPHATE 10 MG/ML IJ SOLN
INTRAMUSCULAR | Status: AC
  Filled 2018-09-25: qty 1

## 2018-09-25 MED ORDER — OXYCODONE HCL 5 MG PO TABS
5.0000 mg | ORAL_TABLET | Freq: Once | ORAL | Status: DC | PRN
  Filled 2018-09-25: qty 1

## 2018-09-25 MED ORDER — PROMETHAZINE HCL 12.5 MG PO TABS
12.5000 mg | ORAL_TABLET | Freq: Three times a day (TID) | ORAL | Status: DC | PRN
  Filled 2018-09-25: qty 1

## 2018-09-25 MED ORDER — METHYLPREDNISOLONE ACETATE 40 MG/ML IJ SUSP
INTRAMUSCULAR | Status: DC | PRN
  Administered 2018-09-25: 80 mg

## 2018-09-25 MED ORDER — HYDROCODONE-ACETAMINOPHEN 5-325 MG PO TABS
1.0000 | ORAL_TABLET | ORAL | Status: DC | PRN
  Filled 2018-09-25: qty 2

## 2018-09-25 MED ORDER — KETOROLAC TROMETHAMINE 30 MG/ML IJ SOLN
INTRAMUSCULAR | Status: AC
  Filled 2018-09-25: qty 1

## 2018-09-25 MED ORDER — METHYLPREDNISOLONE ACETATE 80 MG/ML IJ SUSP
80.0000 mg | Freq: Once | INTRAMUSCULAR | Status: DC
  Filled 2018-09-25: qty 1

## 2018-09-25 MED ORDER — ONDANSETRON HCL 4 MG PO TABS
4.0000 mg | ORAL_TABLET | Freq: Four times a day (QID) | ORAL | Status: DC | PRN
  Filled 2018-09-25: qty 1

## 2018-09-25 MED ORDER — FENTANYL CITRATE (PF) 100 MCG/2ML IJ SOLN
INTRAMUSCULAR | Status: DC | PRN
  Administered 2018-09-25: 100 ug via INTRAVENOUS

## 2018-09-25 MED ORDER — FENTANYL CITRATE (PF) 100 MCG/2ML IJ SOLN
25.0000 ug | INTRAMUSCULAR | Status: DC | PRN
  Administered 2018-09-25: 25 ug via INTRAVENOUS
  Filled 2018-09-25: qty 1

## 2018-09-25 MED ORDER — PHENYLEPHRINE 40 MCG/ML (10ML) SYRINGE FOR IV PUSH (FOR BLOOD PRESSURE SUPPORT)
PREFILLED_SYRINGE | INTRAVENOUS | Status: AC
  Filled 2018-09-25: qty 10

## 2018-09-25 MED ORDER — PHENYLEPHRINE 40 MCG/ML (10ML) SYRINGE FOR IV PUSH (FOR BLOOD PRESSURE SUPPORT)
PREFILLED_SYRINGE | INTRAVENOUS | Status: DC | PRN
  Administered 2018-09-25: 80 ug via INTRAVENOUS

## 2018-09-25 MED ORDER — SODIUM CHLORIDE 0.9 % IR SOLN
Status: DC | PRN
  Administered 2018-09-25: 1 mL

## 2018-09-25 MED ORDER — OXYCODONE HCL 5 MG/5ML PO SOLN
5.0000 mg | Freq: Once | ORAL | Status: DC | PRN
  Filled 2018-09-25: qty 5

## 2018-09-25 MED ORDER — CEFAZOLIN SODIUM-DEXTROSE 2-4 GM/100ML-% IV SOLN
2.0000 g | INTRAVENOUS | Status: AC
  Administered 2018-09-25: 2 g via INTRAVENOUS
  Filled 2018-09-25: qty 100

## 2018-09-25 MED ORDER — METOCLOPRAMIDE HCL 5 MG/ML IJ SOLN
5.0000 mg | Freq: Three times a day (TID) | INTRAMUSCULAR | Status: DC | PRN
  Filled 2018-09-25: qty 2

## 2018-09-25 MED ORDER — PROPOFOL 10 MG/ML IV BOLUS
INTRAVENOUS | Status: DC | PRN
  Administered 2018-09-25: 160 mg via INTRAVENOUS

## 2018-09-25 MED ORDER — ONDANSETRON HCL 4 MG/2ML IJ SOLN
INTRAMUSCULAR | Status: DC | PRN
  Administered 2018-09-25: 4 mg via INTRAVENOUS

## 2018-09-25 MED ORDER — MIDAZOLAM HCL 2 MG/2ML IJ SOLN
INTRAMUSCULAR | Status: AC
  Filled 2018-09-25: qty 2

## 2018-09-25 MED ORDER — ONDANSETRON HCL 4 MG PO TABS
4.0000 mg | ORAL_TABLET | Freq: Three times a day (TID) | ORAL | Status: DC | PRN
  Filled 2018-09-25: qty 2

## 2018-09-25 MED ORDER — METOCLOPRAMIDE HCL 5 MG/ML IJ SOLN
10.0000 mg | Freq: Once | INTRAMUSCULAR | Status: DC | PRN
  Filled 2018-09-25: qty 2

## 2018-09-25 MED ORDER — SCOPOLAMINE 1 MG/3DAYS TD PT72
1.0000 | MEDICATED_PATCH | TRANSDERMAL | Status: DC
  Administered 2018-09-25: 1.5 mg via TRANSDERMAL
  Filled 2018-09-25: qty 1

## 2018-09-25 MED ORDER — FENTANYL CITRATE (PF) 100 MCG/2ML IJ SOLN
INTRAMUSCULAR | Status: AC
  Filled 2018-09-25: qty 2

## 2018-09-25 MED ORDER — ONDANSETRON HCL 4 MG/2ML IJ SOLN: INTRAMUSCULAR | Status: DC | PRN

## 2018-09-25 MED ORDER — SODIUM CHLORIDE 0.9 % IV SOLN
INTRAVENOUS | Status: DC
  Administered 2018-09-25: 10:00:00 via INTRAVENOUS
  Filled 2018-09-25: qty 1000

## 2018-09-25 MED ORDER — KETOROLAC TROMETHAMINE 30 MG/ML IJ SOLN
INTRAMUSCULAR | Status: DC | PRN
  Administered 2018-09-25: 30 mg via INTRAVENOUS

## 2018-09-25 MED ORDER — DOCUSATE SODIUM 100 MG PO CAPS
100.0000 mg | ORAL_CAPSULE | Freq: Two times a day (BID) | ORAL | Status: DC
  Filled 2018-09-25: qty 1

## 2018-09-25 MED ORDER — CEFAZOLIN SODIUM-DEXTROSE 2-4 GM/100ML-% IV SOLN
INTRAVENOUS | Status: AC
  Filled 2018-09-25: qty 100

## 2018-09-25 MED ORDER — SODIUM CHLORIDE 0.9 % IV SOLN
INTRAVENOUS | Status: DC
  Administered 2018-09-25: 13:00:00 via INTRAVENOUS
  Filled 2018-09-25: qty 1000

## 2018-09-25 MED ORDER — DEXAMETHASONE SODIUM PHOSPHATE 10 MG/ML IJ SOLN
INTRAMUSCULAR | Status: DC | PRN
  Administered 2018-09-25: 10 mg via INTRAVENOUS

## 2018-09-25 MED ORDER — MEPERIDINE HCL 25 MG/ML IJ SOLN
6.2500 mg | INTRAMUSCULAR | Status: DC | PRN
  Filled 2018-09-25: qty 1

## 2018-09-25 MED ORDER — METOCLOPRAMIDE HCL 5 MG PO TABS
5.0000 mg | ORAL_TABLET | Freq: Three times a day (TID) | ORAL | Status: DC | PRN
  Filled 2018-09-25: qty 2

## 2018-09-25 MED ORDER — SCOPOLAMINE 1 MG/3DAYS TD PT72
MEDICATED_PATCH | TRANSDERMAL | Status: AC
  Filled 2018-09-25: qty 1

## 2018-09-25 MED ORDER — MIDAZOLAM HCL 5 MG/5ML IJ SOLN
INTRAMUSCULAR | Status: DC | PRN
  Administered 2018-09-25: 2 mg via INTRAVENOUS

## 2018-09-25 MED ORDER — HYDROCODONE-ACETAMINOPHEN 5-325 MG PO TABS: 1.0000 | ORAL_TABLET | ORAL | 0 refills | Status: DC | PRN

## 2018-09-25 MED ORDER — PROPOFOL 10 MG/ML IV BOLUS
INTRAVENOUS | Status: AC
  Filled 2018-09-25: qty 20

## 2018-09-25 MED ORDER — LIDOCAINE 2% (20 MG/ML) 5 ML SYRINGE
INTRAMUSCULAR | Status: DC | PRN
  Administered 2018-09-25: 100 mg via INTRAVENOUS

## 2018-09-25 MED ORDER — ONDANSETRON HCL 4 MG/2ML IJ SOLN
INTRAMUSCULAR | Status: AC
  Filled 2018-09-25: qty 2

## 2018-09-25 MED ORDER — LIDOCAINE 2% (20 MG/ML) 5 ML SYRINGE
INTRAMUSCULAR | Status: AC
  Filled 2018-09-25: qty 5

## 2018-09-25 MED ORDER — CHLORHEXIDINE GLUCONATE 4 % EX LIQD
60.0000 mL | Freq: Once | CUTANEOUS | Status: DC
  Filled 2018-09-25: qty 118

## 2018-09-25 SURGICAL SUPPLY — 36 items
BANDAGE ACE 6X5 VEL STRL LF (GAUZE/BANDAGES/DRESSINGS) ×2 IMPLANT
BANDAGE ESMARK 6X9 LF (GAUZE/BANDAGES/DRESSINGS) IMPLANT
BLADE 4.2CUDA (BLADE) ×2 IMPLANT
BNDG ESMARK 6X9 LF (GAUZE/BANDAGES/DRESSINGS)
BNDG GAUZE ELAST 4 BULKY (GAUZE/BANDAGES/DRESSINGS) ×2 IMPLANT
COVER WAND RF STERILE (DRAPES) IMPLANT
DISSECTOR  3.8MM X 13CM (MISCELLANEOUS) ×1
DISSECTOR 3.5MM X 13CM CVD (MISCELLANEOUS) IMPLANT
DISSECTOR 3.8MM X 13CM (MISCELLANEOUS) ×1 IMPLANT
DRAPE ARTHROSCOPY W/POUCH 90 (DRAPES) ×2 IMPLANT
DRSG EMULSION OIL 3X3 NADH (GAUZE/BANDAGES/DRESSINGS) ×2 IMPLANT
DURAPREP 26ML APPLICATOR (WOUND CARE) ×2 IMPLANT
EXCALIBUR 3.8MM X 13CM (MISCELLANEOUS) IMPLANT
GAUZE SPONGE 4X4 12PLY STRL (GAUZE/BANDAGES/DRESSINGS) ×2 IMPLANT
GLOVE BIO SURGEON STRL SZ7.5 (GLOVE) ×2 IMPLANT
GLOVE BIOGEL PI IND STRL 8 (GLOVE) ×2 IMPLANT
GLOVE BIOGEL PI INDICATOR 8 (GLOVE) ×2
GLOVE SURG ORTHO 8.0 STRL STRW (GLOVE) ×2 IMPLANT
GOWN STRL REUS W/ TWL LRG LVL3 (GOWN DISPOSABLE) ×1 IMPLANT
GOWN STRL REUS W/ TWL XL LVL3 (GOWN DISPOSABLE) ×1 IMPLANT
GOWN STRL REUS W/TWL LRG LVL3 (GOWN DISPOSABLE) ×1
GOWN STRL REUS W/TWL XL LVL3 (GOWN DISPOSABLE) ×3 IMPLANT
HOLDER KNEE FOAM BLUE (MISCELLANEOUS) ×2 IMPLANT
KNEE WRAP E Z 3 GEL PACK (MISCELLANEOUS) ×2 IMPLANT
MANIFOLD NEPTUNE II (INSTRUMENTS) ×2 IMPLANT
NDL SAFETY ECLIPSE 18X1.5 (NEEDLE) ×1 IMPLANT
NEEDLE HYPO 18GX1.5 SHARP (NEEDLE) ×1
PACK ARTHROSCOPY DSU (CUSTOM PROCEDURE TRAY) ×2 IMPLANT
PACK BASIN DAY SURGERY FS (CUSTOM PROCEDURE TRAY) ×2 IMPLANT
PACK ICE MAXI GEL EZY WRAP (MISCELLANEOUS) ×2 IMPLANT
PORT APPOLLO RF 90DEGREE MULTI (SURGICAL WAND) IMPLANT
PROBE BIPOLAR ARTHRO 85MM 30D (MISCELLANEOUS) IMPLANT
SUT ETHILON 4 0 PS 2 18 (SUTURE) ×2 IMPLANT
SYR 5ML LL (SYRINGE) ×2 IMPLANT
TUBING ARTHROSCOPY IRRIG 16FT (MISCELLANEOUS) ×2 IMPLANT
WATER STERILE IRR 1000ML POUR (IV SOLUTION) ×2 IMPLANT

## 2018-09-25 NOTE — Interval H&P Note (Signed)
History and Physical Interval Note:  09/25/2018 11:10 AM  Angel Humphrey  has presented today for surgery, with the diagnosis of RIGHT KNEE [place].  The various methods of treatment have been discussed with the patient and family. After consideration of risks, benefits and other options for treatment, the patient has consented to  Procedure(s): RIGHT KNEE ARTHROSCOPY WITH LATERAL  MENISECTOMY WITH POSSIBLE MICROFRACTURE (Right) as a surgical intervention.  The patient's history has been reviewed, patient examined, no change in status, stable for surgery.  I have reviewed the patient's chart and labs.  Questions were answered to the patient's satisfaction.     Yvette Rack

## 2018-09-25 NOTE — Anesthesia Preprocedure Evaluation (Signed)
Anesthesia Evaluation  Patient identified by MRN, date of birth, ID band Patient awake    Reviewed: Allergy & Precautions, NPO status , Patient's Chart, lab work & pertinent test results  History of Anesthesia Complications (+) PONV and history of anesthetic complications  Airway Mallampati: II  TM Distance: >3 FB Neck ROM: Full    Dental no notable dental hx. (+) Teeth Intact   Pulmonary neg pulmonary ROS,    Pulmonary exam normal breath sounds clear to auscultation       Cardiovascular hypertension, Pt. on medications Normal cardiovascular exam Rhythm:Regular Rate:Normal     Neuro/Psych  Headaches,    GI/Hepatic Neg liver ROS, GERD  Controlled and Medicated,  Endo/Other  negative endocrine ROS  Renal/GU negative Renal ROS  negative genitourinary   Musculoskeletal  (+) Arthritis , Osteoarthritis,  TMM right knee   Abdominal   Peds  Hematology negative hematology ROS (+)   Anesthesia Other Findings   Reproductive/Obstetrics                             Anesthesia Physical Anesthesia Plan  ASA: II  Anesthesia Plan: General   Post-op Pain Management:    Induction: Intravenous  PONV Risk Score and Plan: 4 or greater and Scopolamine patch - Pre-op, Midazolam, Dexamethasone, Ondansetron and Treatment may vary due to age or medical condition  Airway Management Planned: LMA  Additional Equipment:   Intra-op Plan:   Post-operative Plan: Extubation in OR  Informed Consent: I have reviewed the patients History and Physical, chart, labs and discussed the procedure including the risks, benefits and alternatives for the proposed anesthesia with the patient or authorized representative who has indicated his/her understanding and acceptance.     Dental advisory given  Plan Discussed with: CRNA and Surgeon  Anesthesia Plan Comments:         Anesthesia Quick Evaluation

## 2018-09-25 NOTE — Anesthesia Postprocedure Evaluation (Signed)
Anesthesia Post Note  Patient: Angel Humphrey  Procedure(s) Performed: RIGHT KNEE ARTHROSCOPY WITH LATERAL  MENISECTOMY WITH CHONDRAPLASTY (Right )     Patient location during evaluation: PACU Anesthesia Type: General Level of consciousness: awake and alert and oriented Pain management: pain level controlled Vital Signs Assessment: post-procedure vital signs reviewed and stable Respiratory status: spontaneous breathing, nonlabored ventilation and respiratory function stable Cardiovascular status: blood pressure returned to baseline and stable Postop Assessment: no apparent nausea or vomiting Anesthetic complications: no    Last Vitals:  Vitals:   09/25/18 1220 09/25/18 1245  BP: 127/89   Pulse: 69 67  Resp: (!) 22 20  Temp: (!) 36.4 C   SpO2: 100% 100%    Last Pain:  Vitals:   09/25/18 1245  TempSrc:   PainSc: 4                  Hilda Wexler A.

## 2018-09-25 NOTE — Anesthesia Procedure Notes (Signed)
Procedure Name: LMA Insertion Date/Time: 09/25/2018 11:22 AM Performed by: Bonney Aid, CRNA Pre-anesthesia Checklist: Patient identified, Emergency Drugs available, Suction available and Patient being monitored Patient Re-evaluated:Patient Re-evaluated prior to induction Oxygen Delivery Method: Circle system utilized Preoxygenation: Pre-oxygenation with 100% oxygen Induction Type: IV induction Ventilation: Mask ventilation without difficulty LMA: LMA inserted LMA Size: 4.0 Number of attempts: 1 Airway Equipment and Method: Bite block Placement Confirmation: positive ETCO2 Tube secured with: Tape Dental Injury: Teeth and Oropharynx as per pre-operative assessment

## 2018-09-25 NOTE — Op Note (Signed)
NAME: Angel Humphrey, Angel Humphrey MEDICAL RECORD BE:[id number] ACCOUNT [id number] DATE OF BIRTH:Mar 31, 1971 FACILITY: [facility] LOCATION: [facility] PHYSICIAN:W. Ludene Stokke JR., MD  OPERATIVE REPORT  DATE OF PROCEDURE:  09/25/2018  PREOPERATIVE DIAGNOSES: 1.  Complex tear, anterior middle horn of the lateral meniscus. 2.  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  POSTOPERATIVE DIAGNOSES: 1.  Complex tear, anterior middle horn of the lateral meniscus. 2.  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  OPERATION: 1.  Partial lateral meniscectomy (approximately 50%). 2.  Debridement chondroplasty of patellofemoral joint and lateral compartment.  SURGEON:  Vangie Bicker, MD  ASSISTANT:  None.  ANESTHESIA:  General with local infiltration.  DESCRIPTION OF PROCEDURE:  General anesthetic, leg holder.  No tourniquet.  Inferomedial and inferolateral portals created.  Very mild chondromalacia patella was identified debridement.  Grade II chondromalacia noted.  Medial compartment, medial meniscus  were normal.  Lateral compartment, a complex tear of the anterior middle horn of the meniscus.  Resection back to the level of the capsule leaving the posterior horn intact.  Mild chondromalacia grade II, early grade III wear noted and debrided.   Certainly, these were not as significant as noted on the MRI, but a chondroplasty was carried out.  Partial lateral meniscectomy noted as well.  Knee drained free of fluid.  We infiltrated the joint with about 8-9 mL of 0.5% Marcaine with addition of 80  mg Depo-Medrol with additional 15 or so mL into the subcutaneous tissue.  A light compressive sterile dressing was applied.    Taken to recovery room in stable condition.  AN/NUANCE  D:09/25/2018 T:09/25/2018 JOB:[id number]

## 2018-09-25 NOTE — Discharge Instructions (Signed)
°  Diet: As you were doing prior to hospitalization   Activity: Increase activity slowly as tolerated  No lifting or driving for 48 hours  Shower: May shower without a dressing on post op day #3, NO SOAKING in tub   Dressing: You may change your dressing on post op day #3.  Then change the dressing daily with sterile 4"x4"s gauze dressing  Or band aids  Weight Bearing: weight bearing as instructed post op. Use a walker or  Crutches as instructed.   To prevent constipation: you may use a stool softener such as -  Colace ( over the counter) 100 mg by mouth twice a day  Drink plenty of fluids ( prune juice may be helpful) and high fiber foods  Miralax ( over the counter) for constipation as needed.   Precautions: If you experience chest pain or shortness of breath - call 911 immediately For transfer to the hospital emergency department!!  If you develop a fever greater that 101 F, purulent drainage from wound, increased redness or drainage from wound, or calf pain -- Call the office   Follow- Up Appointment: Please call for an appointment to be seen in 1 week or as previously scheduled [place] - [phone]   Motrin or Advil after 5:30 pm if needed   Post Anesthesia Home Care Instructions  Activity: Get plenty of rest for the remainder of the day. A responsible individual must stay with you for 24 hours following the procedure.  For the next 24 hours, DO NOT: -Drive a car -Paediatric nurse -Drink alcoholic beverages -Take any medication unless instructed by your physician -Make any legal decisions or sign important papers.  Meals: Start with liquid foods such as gelatin or soup. Progress to regular foods as tolerated. Avoid greasy, spicy, heavy foods. If nausea and/or vomiting occur, drink only clear liquids until the nausea and/or vomiting subsides. Call your physician if vomiting continues.  Special Instructions/Symptoms: Your throat may feel dry or sore from the  anesthesia or the breathing tube placed in your throat during surgery. If this causes discomfort, gargle with warm salt water. The discomfort should disappear within 24 hours.  If you had a scopolamine patch placed behind your ear for the management of post- operative nausea and/or vomiting:  1. The medication in the patch is effective for 72 hours, after which it should be removed.  Wrap patch in a tissue and discard in the trash. Wash hands thoroughly with soap and water. 2. You may remove the patch earlier than 72 hours if you experience unpleasant side effects which may include dry mouth, dizziness or visual disturbances. 3. Avoid touching the patch. Wash your hands with soap and water after contact with the patch.

## 2018-09-25 NOTE — Transfer of Care (Signed)
Immediate Anesthesia Transfer of Care Note  Patient: Angel Humphrey  Procedure(s) Performed: RIGHT KNEE ARTHROSCOPY WITH LATERAL  MENISECTOMY WITH CHONDRAPLASTY (Right )  Patient Location: PACU  Anesthesia Type:General  Level of Consciousness: awake, alert  and oriented  Airway & Oxygen Therapy: Patient Spontanous Breathing and Patient connected to nasal cannula oxygen  Post-op Assessment: Report given to RN  Post vital signs: Reviewed and stable  Last Vitals: 127/89, 70, 22, 100%, 97.5 Vitals Value Taken Time  BP    Temp    Pulse    Resp    SpO2      Last Pain:  Vitals:   09/25/18 0952  TempSrc:   PainSc: 0-No pain      Patients Stated Pain Goal: 4 (14/10/30 1314)  Complications: No apparent anesthesia complications127/89, 81, 15, 100%, 97.5

## 2018-09-26 ENCOUNTER — Encounter (HOSPITAL_BASED_OUTPATIENT_CLINIC_OR_DEPARTMENT_OTHER): Payer: Self-pay | Admitting: Orthopedic Surgery

## 2019-03-04 ENCOUNTER — Encounter: Payer: Self-pay | Admitting: Gastroenterology

## 2019-03-26 ENCOUNTER — Other Ambulatory Visit: Payer: Self-pay

## 2019-03-26 ENCOUNTER — Ambulatory Visit (AMBULATORY_SURGERY_CENTER): Payer: Self-pay | Admitting: *Deleted

## 2019-03-26 VITALS — Temp 97.1°F | Ht 63.0 in | Wt 176.0 lb

## 2019-03-26 DIAGNOSIS — Z1211 Encounter for screening for malignant neoplasm of colon: Secondary | ICD-10-CM

## 2019-03-26 MED ORDER — NA SULFATE-K SULFATE-MG SULF 17.5-3.13-1.6 GM/177ML PO SOLN: ORAL | 0 refills | Status: DC

## 2019-03-26 NOTE — Progress Notes (Signed)
Patient is here in-person for PV. Patient denies any allergies to eggs or soy. Patient denies any problems with anesthesia/sedation. POST po N&V after gallbladder sx per pt.  Patient denies any oxygen use at home. Patient denies taking any diet/weight loss medications or blood thinners. Patient is not being treated for MRSA or C-diff. EMMI education assisgned to patient on colonoscopy, this was explained and instructions given to patient. Suprep 15 off coupon given to pt.   Pt is aware that care partner will wait in the car during procedure; if they feel like they will be too hot to wait in the car; they may wait in the lobby.  We want them to wear a mask (we do not have any that we can provide them), practice social distancing, and we will check their temperatures when they get here.  I did remind patient that their care partner needs to stay in the parking lot the entire time. Pt will wear mask into building.

## 2019-03-31 ENCOUNTER — Encounter: Payer: Self-pay | Admitting: Gastroenterology

## 2019-04-08 ENCOUNTER — Telehealth: Payer: Self-pay

## 2019-04-08 NOTE — Telephone Encounter (Signed)
Covid-19 screening questions   Do you now or have you had a fever in the last 14 days? NO   Do you have any respiratory symptoms of shortness of breath or cough now or in the last 14 days? NO  Do you have any family members or close contacts with diagnosed or suspected Covid-19 in the past 14 days? NO  Have you been tested for Covid-19 and found to be positive? NO        

## 2019-04-09 ENCOUNTER — Ambulatory Visit (AMBULATORY_SURGERY_CENTER): Payer: PRIVATE HEALTH INSURANCE | Admitting: Gastroenterology

## 2019-04-09 ENCOUNTER — Other Ambulatory Visit: Payer: Self-pay

## 2019-04-09 ENCOUNTER — Encounter: Payer: Self-pay | Admitting: Gastroenterology

## 2019-04-09 VITALS — BP 129/73 | HR 73 | Temp 98.1°F | Resp 26 | Ht 63.0 in | Wt 176.2 lb

## 2019-04-09 DIAGNOSIS — Z1211 Encounter for screening for malignant neoplasm of colon: Secondary | ICD-10-CM

## 2019-04-09 MED ORDER — SODIUM CHLORIDE 0.9 % IV SOLN: 500.0000 mL | Freq: Once | INTRAVENOUS | Status: DC

## 2019-04-09 NOTE — Progress Notes (Signed)
To PACU VSS. Report to [email or web address] 

## 2019-04-09 NOTE — Patient Instructions (Signed)
Handouts given for diverticulosis, hemorrhoids and high fiber diet.  YOU HAD AN ENDOSCOPIC PROCEDURE TODAY AT THE [facility]:   Refer to the procedure report that was given to you for any specific questions about what was found during the examination.  If the procedure report does not answer your questions, please call your gastroenterologist to clarify.  If you requested that your care partner not be given the details of your procedure findings, then the procedure report has been included in a sealed envelope for you to review at your convenience later.  YOU SHOULD EXPECT: Some feelings of bloating in the abdomen. Passage of more gas than usual.  Walking can help get rid of the air that was put into your GI tract during the procedure and reduce the bloating. If you had a lower endoscopy (such as a colonoscopy or flexible sigmoidoscopy) you may notice spotting of blood in your stool or on the toilet paper. If you underwent a bowel prep for your procedure, you may not have a normal bowel movement for a few days.  Please Note:  You might notice some irritation and congestion in your nose or some drainage.  This is from the oxygen used during your procedure.  There is no need for concern and it should clear up in a day or so.  SYMPTOMS TO REPORT IMMEDIATELY:   Following lower endoscopy (colonoscopy or flexible sigmoidoscopy):  Excessive amounts of blood in the stool  Significant tenderness or worsening of abdominal pains  Swelling of the abdomen that is new, acute  Fever of 100F or higher   For urgent or emergent issues, a gastroenterologist can be reached at any hour by calling [phone].   DIET:  We do recommend a small meal at first, but then you may proceed to your regular diet.  Drink plenty of fluids but you should avoid alcoholic beverages for 24 hours.  ACTIVITY:  You should plan to take it easy for the rest of today and you should NOT DRIVE or use heavy machinery  until tomorrow (because of the sedation medicines used during the test).    FOLLOW UP: Our staff will call the number listed on your records 48-72 hours following your procedure to check on you and address any questions or concerns that you may have regarding the information given to you following your procedure. If we do not reach you, we will leave a message.  We will attempt to reach you two times.  During this call, we will ask if you have developed any symptoms of COVID 19. If you develop any symptoms (ie: fever, flu-like symptoms, shortness of breath, cough etc.) before then, please call [phone].  If you test positive for Covid 19 in the 2 weeks post procedure, please call and report this information to us.    If any biopsies were taken you will be contacted by phone or by letter within the next 1-3 weeks.  Please call us at [phone] if you have not heard about the biopsies in 3 weeks.    SIGNATURES/CONFIDENTIALITY: You and/or your care partner have signed paperwork which will be entered into your electronic medical record.  These signatures attest to the fact that that the information above on your After Visit Summary has been reviewed and is understood.  Full responsibility of the confidentiality of this discharge information lies with you and/or your care-partner. 

## 2019-04-09 NOTE — Op Note (Signed)
[facility] Patient Name: Angel Humphrey Procedure Date: 04/09/2019 1:26 PM MRN: [id number] Endoscopist: Ladene Artist , MD Age: 48 Referring MD:  Date of Birth: June 22, 1971 Gender: Female Account #: [id number] Procedure:                Colonoscopy Indications:              Screening for colorectal malignant neoplasm Medicines:                Monitored Anesthesia Care Procedure:                Pre-Anesthesia Assessment:                           - Prior to the procedure, a History and Physical                            was performed, and patient medications and                            allergies were reviewed. The patient's tolerance of                            previous anesthesia was also reviewed. The risks                            and benefits of the procedure and the sedation                            options and risks were discussed with the patient.                            All questions were answered, and informed consent                            was obtained. Prior Anticoagulants: The patient has                            taken no previous anticoagulant or antiplatelet                            agents. ASA Grade Assessment: II - A patient with                            mild systemic disease. After reviewing the risks                            and benefits, the patient was deemed in                            satisfactory condition to undergo the procedure.                           After obtaining informed consent, the colonoscope  was passed under direct vision. Throughout the                            procedure, the patient's blood pressure, pulse, and                            oxygen saturations were monitored continuously. The                            Colonoscope was introduced through the anus and                            advanced to the the cecum, identified by                            appendiceal orifice and  ileocecal valve. The                            ileocecal valve, appendiceal orifice, and rectum                            were photographed. The quality of the bowel                            preparation was good. The colonoscopy was performed                            without difficulty. The patient tolerated the                            procedure well. Scope In: 1:38:06 PM Scope Out: 1:52:44 PM Scope Withdrawal Time: 0 hours 11 minutes 34 seconds  Total Procedure Duration: 0 hours 14 minutes 38 seconds  Findings:                 The perianal and digital rectal examinations were                            normal.                           Multiple small-mouthed diverticula were found in                            the left colon. There was evidence of diverticular                            spasm. There was no evidence of diverticular                            bleeding.                           Anal papilla(e) were hypertrophied.  Internal hemorrhoids were found during                            retroflexion. The hemorrhoids were small and Grade                            I (internal hemorrhoids that do not prolapse).                           The exam was otherwise without abnormality on                            direct and retroflexion views. Complications:            No immediate complications. Estimated blood loss:                            None. Estimated Blood Loss:     Estimated blood loss: none. Impression:               - Mild diverticulosis in the left colon.                           - Anal papilla(e) were hypertrophied.                           - Internal hemorrhoids.                           - The examination was otherwise normal on direct                            and retroflexion views.                           - No specimens collected. Recommendation:           - Repeat colonoscopy in 10 years for screening                             purposes.                           - Patient has a contact number available for                            emergencies. The signs and symptoms of potential                            delayed complications were discussed with the                            patient. Return to normal activities tomorrow.                            Written discharge instructions were provided to the  patient.                           - Continue present medications.                           - High fiber diet. Ladene Artist, MD 04/09/2019 1:56:08 PM This report has been signed electronically.

## 2019-04-11 ENCOUNTER — Telehealth: Payer: Self-pay

## 2019-04-11 NOTE — Telephone Encounter (Signed)
  Follow up Call-  Call back number 04/09/2019  Post procedure Call Back phone  # [phone]  Permission to leave phone message Yes     Patient questions:  Do you have a fever, pain , or abdominal swelling? No. Pain Score  0 *  Have you tolerated food without any problems? Yes.    Have you been able to return to your normal activities? Yes.    Do you have any questions about your discharge instructions: Diet   No. Medications  No. Follow up visit  No.  Do you have questions or concerns about your Care? No.  Actions: * If pain score is 4 or above: No action needed, pain <4. 1. Have you developed a fever since your procedure? no  2.   Have you had an respiratory symptoms (SOB or cough) since your procedure? no  3.   Have you tested positive for COVID 19 since your procedure.no  4.   Have you had any family members/close contacts diagnosed with the COVID 19 since your procedure?  no   If yes to any of these questions please route to Joylene John, RN and Alphonsa Gin, Therapist, sports.

## 2019-09-20 IMAGING — MR MR KNEE*R* W/O CM
4 of 7 series · 19 of 40 positions shown · non-contrast
Comparison: None.

CLINICAL DATA: Right knee pain.  Concern for meniscal tear.

EXAM:
MRI OF THE RIGHT KNEE WITHOUT CONTRAST
TECHNIQUE: Multiplanar, multisequence MR imaging of the knee was performed. No
intravenous contrast was administered.

[Series 3: T2 fat-sat · axial · 4.0mm · 0.31mm/px · z∈[-42,+64]mm · 3 of 25 slices shown]
[im 1/25]
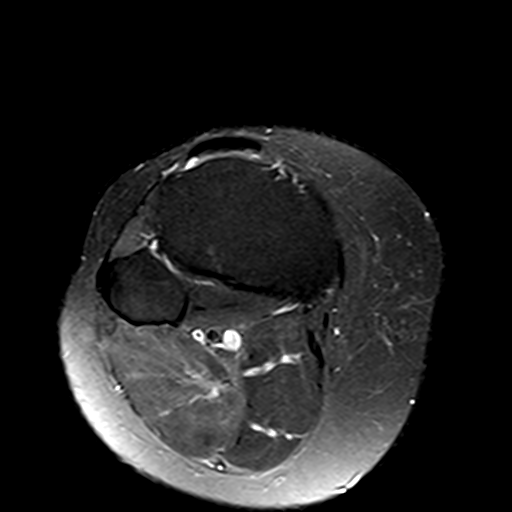
[im 13/25]
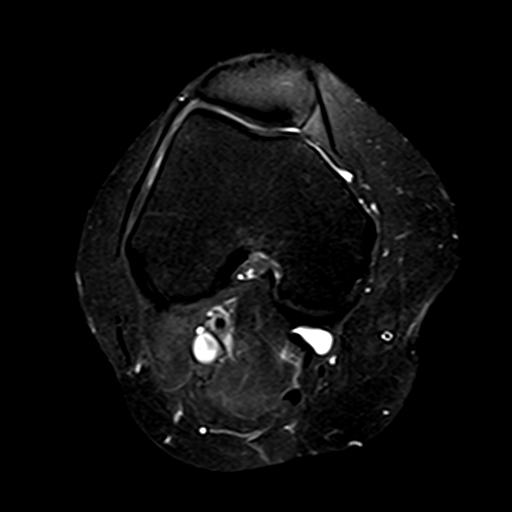
[im 25/25]
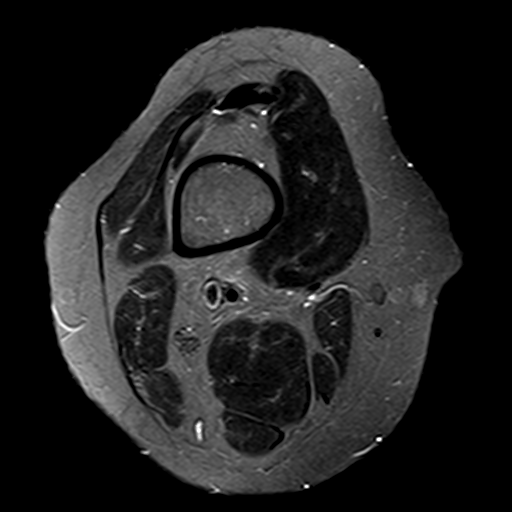

[Series 6: PD fat-sat · coronal · 3.0mm · 0.29mm/px · 7 of 28 slices shown (1 of 3)]
[im 1/28]
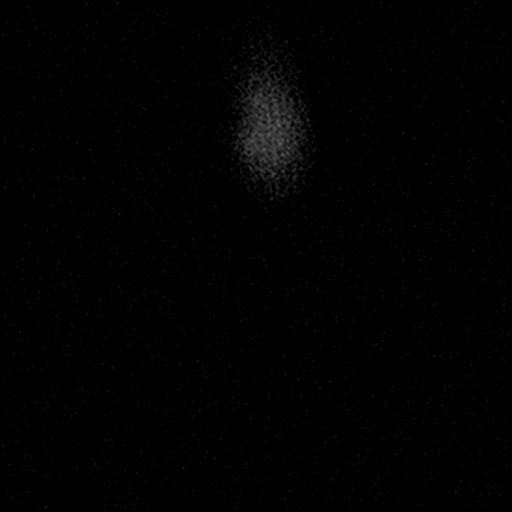
[im 5/28]
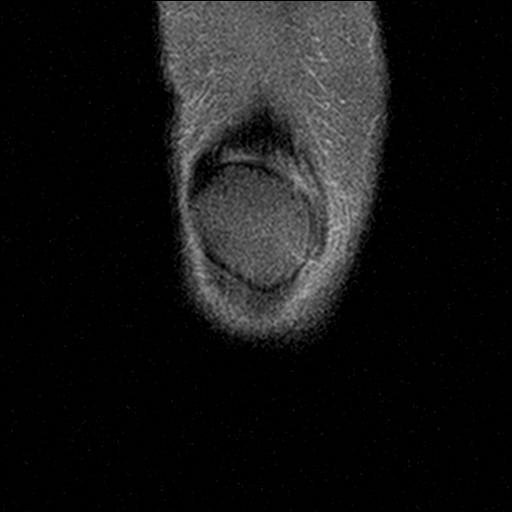
[im 10/28]
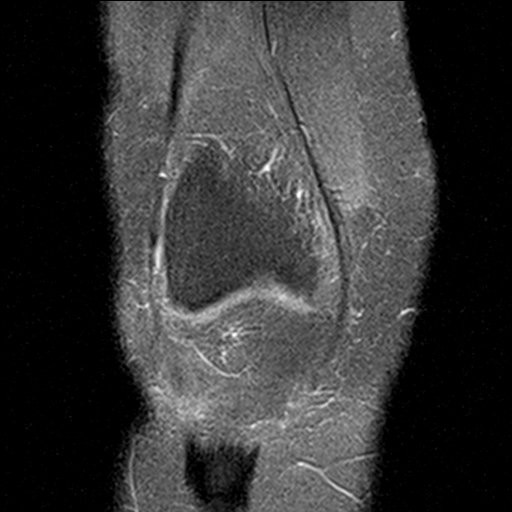
[im 14/28]
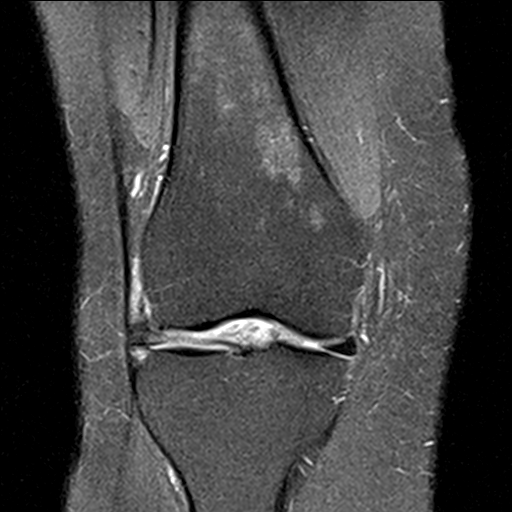
[im 19/28]
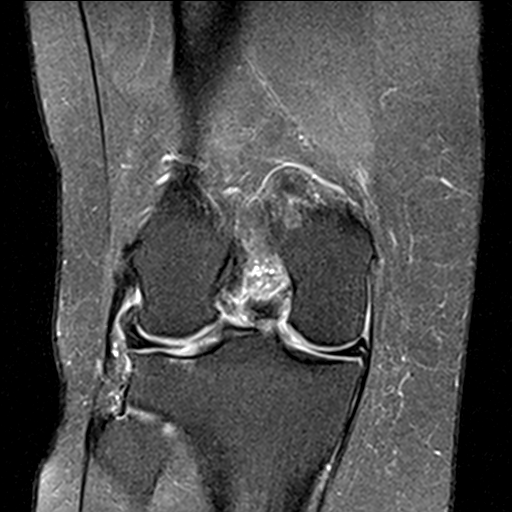
[im 23/28]
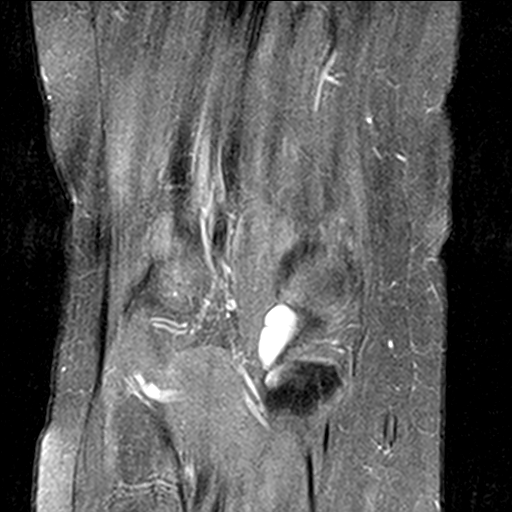
[im 28/28]
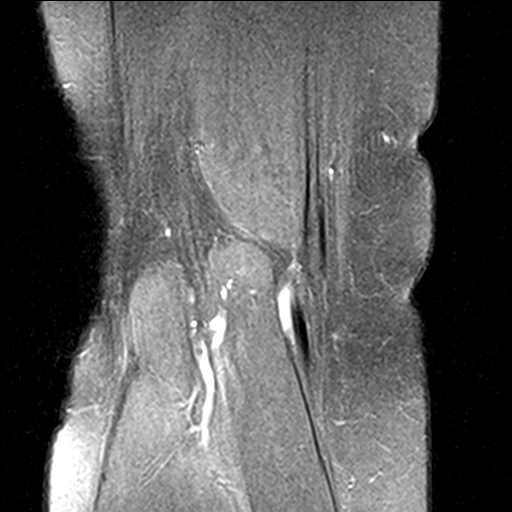

[Series 7: PD fat-sat · sagittal · 3.0mm · 0.29mm/px · 6 of 30 slices shown (2 of 3)]
[im 1/30]
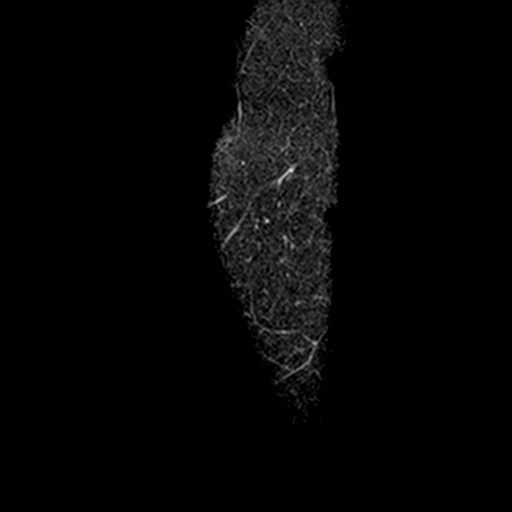
[im 5/30]
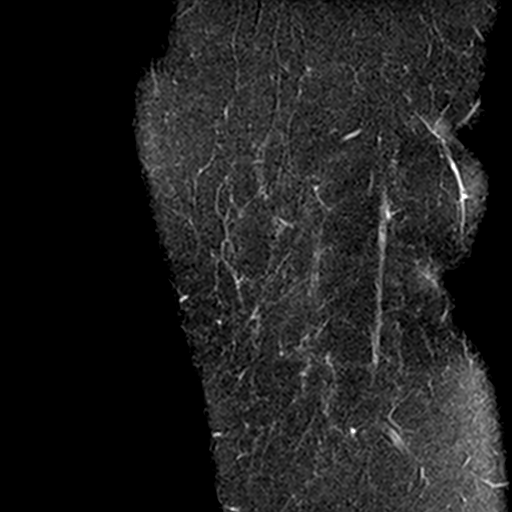
[im 9/30]
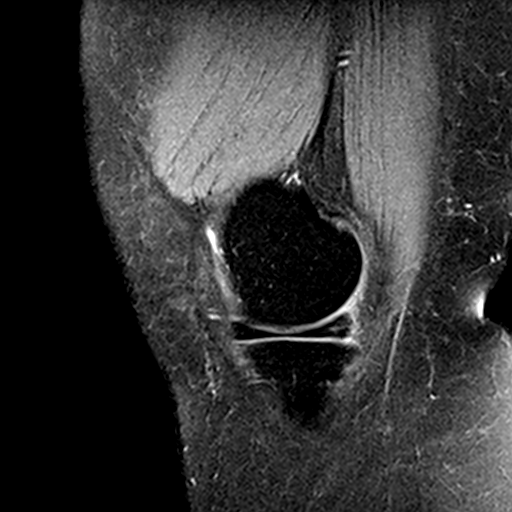
[im 13/30]
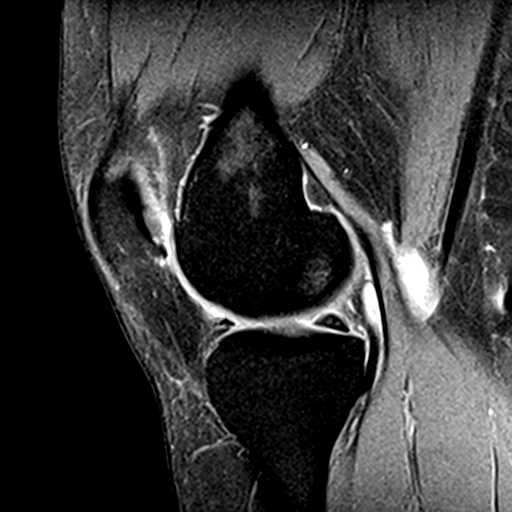
[im 17/30]
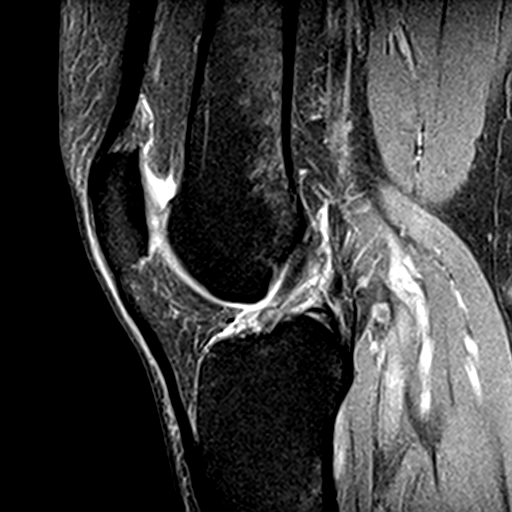
[im 25/30]
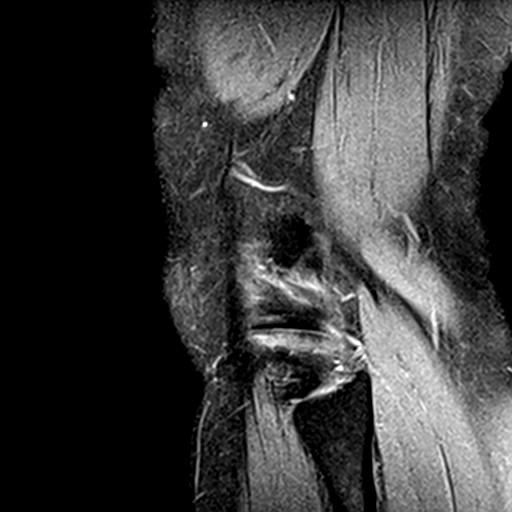

[Series 9: PD fat-sat · oblique · 2.0mm · 0.29mm/px · 3 of 11 slices shown (3 of 3)]
[im 1/11]
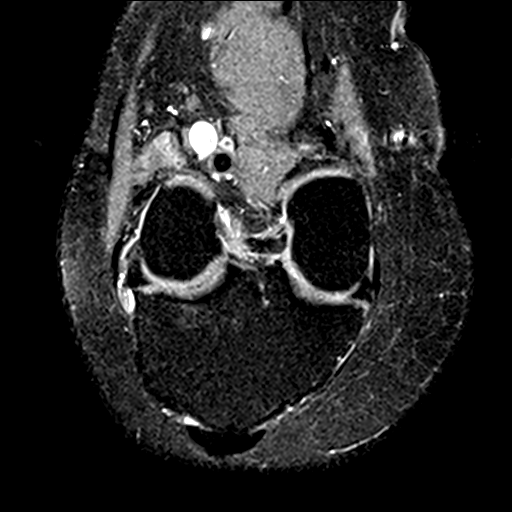
[im 6/11]
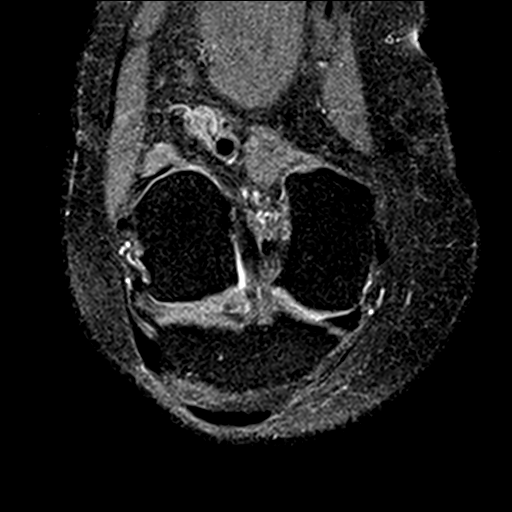
[im 11/11]
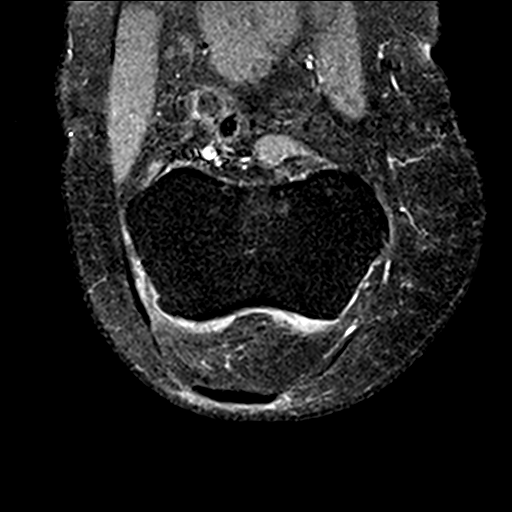

[19 of 40 positions shown; findings below may reference images not displayed]

FINDINGS: MENISCI

Medial meniscus: Intact. Linear degenerative signal in the body and
posterior horn.

Lateral meniscus:  Horizontal tear of the anterior horn and body.

LIGAMENTS

Cruciates:  Intact ACL and PCL.

Collaterals: Medial collateral ligament is intact. Lateral
collateral ligament complex is intact.

CARTILAGE

Patellofemoral: Focal delamination over the lateral patellar facet.
Thinning of the cartilage over the trochlea.

Medial:  Mild partial-thickness cartilage loss.

Lateral: Mild partial-thickness cartilage loss with focal
full-thickness fissuring and delamination over the central medial
tibial plateau with underlying subchondral marrow edema.

Joint: No significant joint effusion. Normal Hoffa's fat. No plical
thickening.

Popliteal Fossa:  Small Baker cyst.  Intact popliteus tendon.

Extensor Mechanism: Intact quadriceps tendon and patellar tendon.
Intact medial and lateral patellar retinaculum. Intact MPFL.

Bones:  No acute fracture or dislocation.  No focal bone lesion.

Other: None.
IMPRESSION: 1. Horizontal tear of the lateral meniscus anterior horn and body.
2. Mild tricompartmental osteoarthritis.
3. Small Baker cyst.

## 2023-07-13 ENCOUNTER — Encounter (HOSPITAL_COMMUNITY): Payer: Self-pay

## 2023-07-13 ENCOUNTER — Emergency Department (HOSPITAL_COMMUNITY): Payer: PRIVATE HEALTH INSURANCE

## 2023-07-13 ENCOUNTER — Other Ambulatory Visit: Payer: Self-pay

## 2023-07-13 ENCOUNTER — Emergency Department (HOSPITAL_COMMUNITY)
Admission: EM | Admit: 2023-07-13 | Discharge: 2023-07-13 | Disposition: A | Discharge status: Home / Self Care | Payer: PRIVATE HEALTH INSURANCE | Attending: Emergency Medicine | Admitting: Emergency Medicine

## 2023-07-13 DIAGNOSIS — M79604 Pain in right leg: Secondary | ICD-10-CM

## 2023-07-13 DIAGNOSIS — W208XXA Other cause of strike by thrown, projected or falling object, initial encounter: Secondary | ICD-10-CM | POA: Insufficient documentation

## 2023-07-13 DIAGNOSIS — S8011XA Contusion of right lower leg, initial encounter: Secondary | ICD-10-CM | POA: Insufficient documentation

## 2023-07-13 MED ORDER — OXYCODONE HCL 5 MG PO TABS: 5.0000 mg | ORAL_TABLET | ORAL | 0 refills | Status: AC | PRN

## 2023-07-13 MED ORDER — OXYCODONE-ACETAMINOPHEN 5-325 MG PO TABS
1.0000 | ORAL_TABLET | ORAL | Status: DC | PRN
  Administered 2023-07-13: 1 via ORAL
  Filled 2023-07-13: qty 1

## 2023-07-13 NOTE — ED Provider Notes (Signed)
 [place] [facility] Provider Note   CSN: [id number] Arrival date & time: 07/13/23  1800     History  Chief Complaint  Patient presents with   Leg Pain    Angel Humphrey is a 53 y.o. female no pertinent past medical history presented after a piece of [place] landed on her right leg at 1720.  Patient states that she is able to feel her right lower extremity and move her toes and ankle but that her right leg does appear swollen but denies open wounds or skin color changes.  Patient states that her leg does still feel soft.  Patient does not want to bear weight due to the pain.  Patient denies blood thinners.  Home Medications Prior to Admission medications   Medication Sig Start Date End Date Taking? Authorizing Provider  oxyCODONE  (ROXICODONE ) 5 MG immediate release tablet Take 1 tablet (5 mg total) by mouth every 4 (four) hours as needed for up to 3 days for severe pain (pain score 7-10). 07/13/23 07/16/23 Yes Melis Trochez, Lynwood DASEN, PA-C  diphenhydrAMINE  (BENADRYL ) 25 mg capsule Take 1 capsule (25 mg total) by mouth every 6 (six) hours as needed for itching or sleep. 06/05/17   Johnson, Clanford L, MD  losartan -hydrochlorothiazide  (HYZAAR) 50-12.5 MG tablet Take 1 tablet by mouth daily.    [provider]  montelukast (SINGULAIR) 10 MG tablet Take 10 mg by mouth daily. 03/08/19   [provider]  naproxen sodium (ALEVE) 220 MG tablet Take 220 mg by mouth daily as needed.    [provider]  omeprazole (PRILOSEC) 40 MG capsule Take 40 mg by mouth every morning.     [provider]  ondansetron  (ZOFRAN ) 4 MG tablet Take 1 tablet (4 mg total) by mouth every 8 (eight) hours as needed for nausea or vomiting. Patient not taking: Reported on 03/26/2019 06/15/17   Shirlean Therisa ORN, NP      Allergies    Patient has no known allergies.    Review of Systems   Review of Systems  Physical Exam Updated Vital Signs BP (!) 175/88   Pulse  85   Temp 98.1 F (36.7 C)   Resp 16   Ht 5' 3 (1.6 m)   Wt 86.2 kg   SpO2 99%   BMI 33.66 kg/m  Physical Exam Vitals reviewed.  Constitutional:      General: She is not in acute distress. Cardiovascular:     Rate and Rhythm: Normal rate.     Pulses: Normal pulses.  Musculoskeletal:     Comments: Right lower extremity: Edematous when compared to left with nonpitting edema, no calf tenderness, no bony tenderness, no open wounds indicative of open fractures, no obvious deformities, 5 out of 5 plantarflexion/dorsiflexion, able to wiggle toes, able to range knee Does not want to bear weight due to pain Pain not out of proportion Soft compartments  Skin:    General: Skin is warm and dry.     Capillary Refill: Capillary refill takes less than 2 seconds.     Findings: Bruising (Bruising noted on anterior portion of right lower leg) present.  Neurological:     Mental Status: She is alert.     Comments: Sensation intact distally  Psychiatric:        Mood and Affect: Mood normal.     ED Results / Procedures / Treatments   Labs (all labs ordered are listed, but only abnormal results are displayed) Labs Reviewed -  No data to display  EKG None  Radiology DG Knee Complete 4 Views Right Result Date: 07/13/2023 CLINICAL DATA:  Piece of Granite fell on leg, pain EXAM: RIGHT KNEE - COMPLETE 4+ VIEW; RIGHT TIBIA AND FIBULA - 2 VIEW; RIGHT FOOT COMPLETE - 3+ VIEW; RIGHT ANKLE - COMPLETE 3+ VIEW COMPARISON:  None Available. FINDINGS: No evidence of fracture, dislocation, or joint effusion. Mild tricompartmental arthrosis of the knee. Bunion deformity of the right great toe with mild associated first metatarsophalangeal arthrosis. Soft tissue edema of the shin. IMPRESSION: 1. No fracture or dislocation of the right knee, tibia and fibula, foot, or ankle. 2. Mild tricompartmental arthrosis of the knee. 3. Bunion deformity of the right great toe with mild associated first metatarsophalangeal  arthrosis. 4. Soft tissue edema of the shin. Electronically Signed   By: Marolyn JONETTA Jaksch M.D.   On: 07/13/2023 20:23   DG Tibia/Fibula Right Result Date: 07/13/2023 CLINICAL DATA:  Piece of Granite fell on leg, pain EXAM: RIGHT KNEE - COMPLETE 4+ VIEW; RIGHT TIBIA AND FIBULA - 2 VIEW; RIGHT FOOT COMPLETE - 3+ VIEW; RIGHT ANKLE - COMPLETE 3+ VIEW COMPARISON:  None Available. FINDINGS: No evidence of fracture, dislocation, or joint effusion. Mild tricompartmental arthrosis of the knee. Bunion deformity of the right great toe with mild associated first metatarsophalangeal arthrosis. Soft tissue edema of the shin. IMPRESSION: 1. No fracture or dislocation of the right knee, tibia and fibula, foot, or ankle. 2. Mild tricompartmental arthrosis of the knee. 3. Bunion deformity of the right great toe with mild associated first metatarsophalangeal arthrosis. 4. Soft tissue edema of the shin. Electronically Signed   By: Marolyn JONETTA Jaksch M.D.   On: 07/13/2023 20:23   DG Ankle Complete Right Result Date: 07/13/2023 CLINICAL DATA:  Piece of Granite fell on leg, pain EXAM: RIGHT KNEE - COMPLETE 4+ VIEW; RIGHT TIBIA AND FIBULA - 2 VIEW; RIGHT FOOT COMPLETE - 3+ VIEW; RIGHT ANKLE - COMPLETE 3+ VIEW COMPARISON:  None Available. FINDINGS: No evidence of fracture, dislocation, or joint effusion. Mild tricompartmental arthrosis of the knee. Bunion deformity of the right great toe with mild associated first metatarsophalangeal arthrosis. Soft tissue edema of the shin. IMPRESSION: 1. No fracture or dislocation of the right knee, tibia and fibula, foot, or ankle. 2. Mild tricompartmental arthrosis of the knee. 3. Bunion deformity of the right great toe with mild associated first metatarsophalangeal arthrosis. 4. Soft tissue edema of the shin. Electronically Signed   By: Marolyn JONETTA Jaksch M.D.   On: 07/13/2023 20:23   DG Foot Complete Right Result Date: 07/13/2023 CLINICAL DATA:  Piece of Granite fell on leg, pain EXAM: RIGHT KNEE - COMPLETE  4+ VIEW; RIGHT TIBIA AND FIBULA - 2 VIEW; RIGHT FOOT COMPLETE - 3+ VIEW; RIGHT ANKLE - COMPLETE 3+ VIEW COMPARISON:  None Available. FINDINGS: No evidence of fracture, dislocation, or joint effusion. Mild tricompartmental arthrosis of the knee. Bunion deformity of the right great toe with mild associated first metatarsophalangeal arthrosis. Soft tissue edema of the shin. IMPRESSION: 1. No fracture or dislocation of the right knee, tibia and fibula, foot, or ankle. 2. Mild tricompartmental arthrosis of the knee. 3. Bunion deformity of the right great toe with mild associated first metatarsophalangeal arthrosis. 4. Soft tissue edema of the shin. Electronically Signed   By: Marolyn JONETTA Jaksch M.D.   On: 07/13/2023 20:23    Procedures Procedures    Medications Ordered in ED Medications  oxyCODONE -acetaminophen  (PERCOCET/ROXICET) 5-325 MG per tablet 1 tablet (1 tablet Oral Given  07/13/23 1841)    ED Course/ Medical Decision Making/ A&P                                 Medical Decision Making Amount and/or Complexity of Data Reviewed Radiology: ordered.  Risk Prescription drug management.   Angel Humphrey 53 y.o. presented today for right leg swelling. Working DDx that I considered at this time includes, but not limited to, contusion, soft tissue injury, compartment syndrome, traumatic DVT, neurovascular compromise.  R/o DDx: compartment syndrome, traumatic DVT, neurovascular compromise: These are considered less likely due to history of present illness, physical exam, labs/imaging findings  Review of prior external notes: 09/25/2018 discharge  Unique Tests and My Interpretation:  Right knee x-ray: Unremarkable Right tib-fib x-ray: Edema noted without bony abnormalities Right ankle x-ray: No acute fracture Right foot x-ray: No acute fractures  Social Determinants of Health: none  Discussion with Independent Historian:  Husband  Discussion of Management of Tests: None  Risk: Medium:  prescription drug management  Risk Stratification Score: None  Plan: On exam patient was in no acute distress stable vitals.  Patient did have right lower leg edema but was neuro vas intact.  Compartments are soft.  X-rays from triage do not show any acute fractures.  I spoke to the patient and offered an ultrasound to rule out traumatic DVT due to the leg swelling however patient declined at this time and states that if the x-rays are negative she feels comfortable going home.  I spoke to the patient about signs and symptoms of compartment syndrome and when she would need to return to the ER to which patient verbalized understanding sums up.  Will give a few days with the pain meds as well due to the swelling.  Encourage patient to follow-up with primary care provider.  Discussed with the patient RICE therapy.  Will give patient crutches as per her request.  Patient was given return precautions. Patient stable for discharge at this time.  Patient verbalized understanding of plan.  This chart was dictated using voice recognition software.  Despite best efforts to proofread,  errors can occur which can change the documentation meaning.         Final Clinical Impression(s) / ED Diagnoses Final diagnoses:  Right leg pain    Rx / DC Orders ED Discharge Orders          Ordered    oxyCODONE  (ROXICODONE ) 5 MG immediate release tablet  Every 4 hours PRN        07/13/23 2149              Betta Balla T, PA-C 07/13/23 2243    Yolande Lamar BROCKS, MD 07/15/23 [phone]

## 2023-07-13 NOTE — ED Triage Notes (Signed)
 Pt reports she was holding a very large slab of granite and it fell onto her right leg today around 1720. Her husband had to pick it up from off her leg and she scooted out from under it. She reports pain from knee down to toes. Swelling to knee. She is able to wiggle her big toe. She is unable to walk on it. +pedal pulse.

## 2023-07-13 NOTE — Discharge Instructions (Signed)
 Please follow-up with your primary care provider in regards recent ER visit.  Today your physical exam was reassuring along with your x-rays as there did not appear to be a fracture.  Please monitor your leg and if you begin to have rigid compartments, decree sensation, pain not controlled by the medications I am prescribing her other changes or worsening symptoms please come back to the ER for further evaluation.  You may take Tylenol  every 6 hours as needed for pain along with ice and elevate your leg.  Pain not controlled by the Tylenol  you may use the pain medications that are prescribed for you.

## 2024-03-18 NOTE — Progress Notes (Unsigned)
 Angel Humphrey, female    DOB: [birth year]    MRN: [id number]   Brief patient profile:  1   yowf  never smoker  referred to pulmonary clinic in [place]  03/19/2024 by Angel Humphrey  for daily cough  x 2023  - works at [employer] in [place] where exposed to molds but coughs even on vacation   Has had allergies all her life all year round  Has GERD for years prior to onset of cough  Pt not previously seen by Angel Humphrey.    History of Present Illness  03/19/2024  Pulmonary/ 1st Humphrey eval/ Angel Humphrey  Chief Complaint  Patient presents with   Establish Care   Cough    Dry cough x2 yrs - all hours  Dyspnea:  powerwalking the dog 15 min includes  Cough: not present on wakening but after AM Tea and driving to point of urinary retention sporadic  Sleep: 4 in bed blocks with one pillow some coughing but not that bad SABA use: albuterol did not help  02: none     No obvious day to day or daytime pattern/variability or assoc excess/ purulent sputum or mucus plugs or hemoptysis or cp or chest tightness, subjective wheeze or overt hb symptoms.    Also denies any obvious fluctuation of symptoms with weather or environmental changes or other aggravating or alleviating factors except as outlined above   No unusual exposure hx or h/o childhood pna/ asthma or knowledge of premature birth.  Current Allergies, Complete Past Medical History, Past Surgical History, Family History, and Social History were reviewed in Angel Humphrey.  ROS  The following are not active complaints unless bolded Hoarseness, sore throat, dysphagia, dental problems, itching, sneezing,  nasal congestion or discharge of excess mucus or purulent secretions, ear ache,   fever, chills, sweats, unintended wt loss or wt gain, classically pleuritic or exertional cp,  orthopnea pnd or arm/hand swelling  or leg swelling, presyncope, palpitations, abdominal pain, anorexia, nausea, vomiting,  diarrhea  or change in bowel habits or change in bladder habits, change in stools or change in urine, dysuria, hematuria,  rash, arthralgias, visual complaints, headache, numbness, weakness or ataxia or problems with walking or coordination,  change in mood or  memory.            Outpatient Medications Prior to Visit  Medication Sig Dispense Refill   atorvastatin (LIPITOR) 20 MG tablet Take 20 mg by mouth daily.     diphenhydrAMINE  (BENADRYL ) 25 mg capsule Take 1 capsule (25 mg total) by mouth every 6 (six) hours as needed for itching or sleep. 30 capsule 0   hydrochlorothiazide  (HYDRODIURIL ) 25 MG tablet Take 25 mg by mouth every morning.     levocetirizine (XYZAL) 5 MG tablet Take 5 mg by mouth every evening.     metFORMIN (GLUCOPHAGE-XR) 500 MG 24 hr tablet Take 500 mg by mouth 2 (two) times daily.     metoprolol succinate (TOPROL-XL) 50 MG 24 hr tablet Take 50 mg by mouth daily.     montelukast (SINGULAIR) 10 MG tablet Take 10 mg by mouth daily.     naproxen sodium (ALEVE) 220 MG tablet Take 220 mg by mouth daily as needed.     omeprazole (PRILOSEC) 40 MG capsule Take 40 mg by mouth every morning.      ondansetron  (ZOFRAN ) 4 MG tablet Take 1 tablet (4 mg total) by mouth every 8 (eight) hours as needed for nausea or vomiting.  90 tablet 1   losartan -hydrochlorothiazide  (HYZAAR) 50-12.5 MG tablet Take 1 tablet by mouth daily. (Patient not taking: Reported on 03/19/2024)     No facility-administered medications prior to visit.    Past Medical History:  Diagnosis Date   Allergy    Baker's cyst of knee, right 08/2018   Claustrophobia    Diverticulosis    Elevated liver enzymes    Gallstones    GERD (gastroesophageal reflux disease)    Headache    Migraines   Hypertension    Interstitial cystitis    Jaundice    Lateral meniscal tear 08/2018   Right   OA (osteoarthritis) of knee 08/2018   Right   Periumbilical hernia 06/02/2017   small, per CT abdomen and pelvis   Periumbilical  hernia 05/2017   PONV (postoperative nausea and vomiting)    Pre-diabetes    Transaminitis 06/12/2017   per Ultrasound   Uterine fibroid 2018      Objective:     BP 115/70   Pulse 74   Ht 5' 3 (1.6 m)   Wt 192 lb 6.4 oz (87.3 kg)   SpO2 97% Comment: ra  BMI 34.08 kg/m   SpO2: 97 % (ra)   pleasant amb wf mod obese by BMI   HEENT : Oropharynx  clear      Nasal turbinates nl/ ears nl also    NECK :  without  apparent JVD/ palpable Nodes/TM    LUNGS: no acc muscle use,  Nl contour chest which is clear to A and P bilaterally without cough on insp or exp maneuvers   CV:  RRR  no s3 or murmur or increase in P2, and no edema   ABD:  soft and nontender   MS:  Gait nl   ext warm without deformities Or obvious joint restrictions  calf tenderness, cyanosis or clubbing    SKIN: warm and dry without lesions    NEURO:  alert, approp, nl sensorium with  no motor or cerebellar deficits apparent.     CXR PA and Lateral:   03/19/2024 :    I personally reviewed images and impression is as follows:     Mild kyphosis/ minimal non-specific markings       Assessment   Assessment & Plan Upper airway cough syndrome Onset was Fall of 2023 provoked intermittently by deep insp maneuvers - Allergy screen 03/19/2024 >  Eos 0. /  IgE  pediing  - 03/19/2024 rx max gerd/ 1st gen H1 blockers per guidelines /pred x 6 d/ diet/tessalon  and delsym   Stongly suspect this is not a pulmonary source but rather Upper airway cough syndrome (previously labeled PNDS),  is so named because it's frequently impossible to sort out how much is  CR/sinusitis with freq throat clearing (which can be related to primary GERD)   vs  causing  secondary ( extra esophageal)  GERD from wide swings in gastric pressure that occur with throat clearing, often  promoting self use of mint and menthol lozenges that reduce the lower esophageal sphincter tone and exacerbate the problem further in a cyclical fashion.   These  are the same pts (now being labeled as having irritable larynx syndrome by some cough centers) who not infrequently have a history of having failed to tolerate ace inhibitors,  dry powder inhalers or biphosphonates or report having atypical/extraesophageal reflux (eg LPR symptoms that don't respond to standard doses of PPI )   and are easily confused as having aecopd or  asthma flares by even experienced allergists/ pulmonologists (myself included).   Of the three most common causes of  Sub-acute / recurrent or chronic cough, only one (GERD)  can actually contribute to/ trigger  the other two (asthma and post nasal drip syndrome)  and perpetuate the cylce of cough.  While not intuitively obvious, many patients with chronic low grade reflux do not cough until there is a primary insult that disturbs the protective epithelial barrier and exposes sensitive nerve endings.   This is typically viral but can due to PNDS and  either may apply here.     >>> The point is that once this occurs, it is difficult to eliminate the cycle  using anything but a maximally effective acid suppression regimen at least in the short run, accompanied by an appropriate diet to address non acid GERD and control / eliminate the cough itself for at least 5 days with delsym/ tessaon  eliminate pnds with 1st gen H1 blockers per guidelines  and also added 6 day taper off  Prednisone  starting at 40 mg per day in case of component of Th-2 driven upper or lower airways inflammation (if cough responds short term only to relapse before return while will on full rx for uacs (as above), then that would point to allergic rhinitis/ asthma or eos bronchitis as alternative dx)          Each maintenance medication was reviewed in detail including emphasizing most importantly the difference between maintenance and prns and under what circumstances the prns are to be triggered using an action plan format where appropriate.  Total time for H and P,  chart review, counseling,   and generating customized AVS unique to this Humphrey visit / same day charting = 50 min new pt eval            AVS  Patient Instructions  Take delsym(over the counter)  two tsp every 12 hours and supplement if needed with  tessalon  200 mg every 6 hours  to suppress the urge to cough. Swallowing water and/or using ice chips/non mint  or menthol containing candies (such as lifesavers or sugarless jolly ranchers) are also effective.  You should rest your voice and avoid activities that you know make you cough.  Once you have eliminated the cough for 5 straight days try reducing tessalon  200   then the delsym as tolerated.    For drainage / throat tickle try take CHLORPHENIRAMINE  4 mg(over the counter)_   start with just a dose or two an hour before bedtime and see how you tolerate it before trying in daytime.    Prednisone  10 mg take  4 each am x 2 days,   2 each am x 2 days,  1 each am x 2 days and stop   Prilosec 40 mg Take 30- 60 min before your first and last meals of the day   Please remember to go to the lab department   for your tests - we will call you with the results when they are available.     Please remember to go to the  x-ray department  @  [facility] for your tests - we will call you with the results when they are available     Please schedule a follow up Humphrey visit in 4 weeks, sooner if needed            Ozell America, MD 03/19/2024

## 2024-03-19 ENCOUNTER — Ambulatory Visit (HOSPITAL_COMMUNITY)
Admission: RE | Admit: 2024-03-19 | Discharge: 2024-03-19 | Disposition: A | Discharge status: Home / Self Care | Payer: PRIVATE HEALTH INSURANCE | Source: Ambulatory Visit | Attending: Internal Medicine | Admitting: Internal Medicine

## 2024-03-19 ENCOUNTER — Encounter: Payer: Self-pay | Admitting: Internal Medicine

## 2024-03-19 ENCOUNTER — Ambulatory Visit: Payer: PRIVATE HEALTH INSURANCE | Admitting: Internal Medicine

## 2024-03-19 VITALS — BP 115/70 | HR 74 | Ht 63.0 in | Wt 192.4 lb

## 2024-03-19 DIAGNOSIS — R058 Other specified cough: Secondary | ICD-10-CM | POA: Insufficient documentation

## 2024-03-19 MED ORDER — BENZONATATE 200 MG PO CAPS: 200.0000 mg | ORAL_CAPSULE | Freq: Three times a day (TID) | ORAL | 1 refills | Status: DC | PRN

## 2024-03-19 MED ORDER — PREDNISONE 10 MG PO TABS: ORAL_TABLET | ORAL | 0 refills | Status: DC

## 2024-03-19 NOTE — Assessment & Plan Note (Addendum)
 Onset was Fall of 2023 provoked intermittently by deep insp maneuvers - Allergy screen 03/19/2024 >  Eos 0. /  IgE  pediing  - 03/19/2024 rx max gerd/ 1st gen H1 blockers per guidelines /pred x 6 d/ diet/tessalon  and delsym   Stongly suspect this is not a pulmonary source but rather Upper airway cough syndrome (previously labeled PNDS),  is so named because it's frequently impossible to sort out how much is  CR/sinusitis with freq throat clearing (which can be related to primary GERD)   vs  causing  secondary ( extra esophageal)  GERD from wide swings in gastric pressure that occur with throat clearing, often  promoting self use of mint and menthol lozenges that reduce the lower esophageal sphincter tone and exacerbate the problem further in a cyclical fashion.   These are the same pts (now being labeled as having irritable larynx syndrome by some cough centers) who not infrequently have a history of having failed to tolerate ace inhibitors,  dry powder inhalers or biphosphonates or report having atypical/extraesophageal reflux (eg LPR symptoms that don't respond to standard doses of PPI )   and are easily confused as having aecopd or asthma flares by even experienced allergists/ pulmonologists (myself included).   Of the three most common causes of  Sub-acute / recurrent or chronic cough, only one (GERD)  can actually contribute to/ trigger  the other two (asthma and post nasal drip syndrome)  and perpetuate the cylce of cough.  While not intuitively obvious, many patients with chronic low grade reflux do not cough until there is a primary insult that disturbs the protective epithelial barrier and exposes sensitive nerve endings.   This is typically viral but can due to PNDS and  either may apply here.     >>> The point is that once this occurs, it is difficult to eliminate the cycle  using anything but a maximally effective acid suppression regimen at least in the short run, accompanied by an  appropriate diet to address non acid GERD and control / eliminate the cough itself for at least 5 days with delsym/ tessaon  eliminate pnds with 1st gen H1 blockers per guidelines  and also added 6 day taper off  Prednisone  starting at 40 mg per day in case of component of Th-2 driven upper or lower airways inflammation (if cough responds short term only to relapse before return while will on full rx for uacs (as above), then that would point to allergic rhinitis/ asthma or eos bronchitis as alternative dx)          Each maintenance medication was reviewed in detail including emphasizing most importantly the difference between maintenance and prns and under what circumstances the prns are to be triggered using an action plan format where appropriate.  Total time for H and P, chart review, counseling,   and generating customized AVS unique to this office visit / same day charting = 50 min new pt eval

## 2024-03-19 NOTE — Patient Instructions (Addendum)
 Take delsym(over the counter)  two tsp every 12 hours and supplement if needed with  tessalon  200 mg every 6 hours  to suppress the urge to cough. Swallowing water and/or using ice chips/non mint  or menthol containing candies (such as lifesavers or sugarless jolly ranchers) are also effective.  You should rest your voice and avoid activities that you know make you cough.  Once you have eliminated the cough for 5 straight days try reducing tessalon  200   then the delsym as tolerated.    For drainage / throat tickle try take CHLORPHENIRAMINE  4 mg(over the counter)_   start with just a dose or two an hour before bedtime and see how you tolerate it before trying in daytime.    Prednisone  10 mg take  4 each am x 2 days,   2 each am x 2 days,  1 each am x 2 days and stop   Prilosec 40 mg Take 30- 60 min before your first and last meals of the day   Please remember to go to the lab department   for your tests - we will call you with the results when they are available.     Please remember to go to the  x-ray department  @  [facility] for your tests - we will call you with the results when they are available     Please schedule a follow up office visit in 4 weeks, sooner if needed

## 2024-03-21 LAB — CBC WITH DIFFERENTIAL/PLATELET
Basophils Absolute: 0.1 x10E3/uL (ref 0.0–0.2)
Basos: 1 %
EOS (ABSOLUTE): 0.5 x10E3/uL — ABNORMAL HIGH (ref 0.0–0.4)
Eos: 5 %
Hematocrit: 41.3 % (ref 34.0–46.6)
Hemoglobin: 13 g/dL (ref 11.1–15.9)
Immature Grans (Abs): 0.1 x10E3/uL (ref 0.0–0.1)
Immature Granulocytes: 1 %
Lymphocytes Absolute: 2 x10E3/uL (ref 0.7–3.1)
Lymphs: 23 %
MCH: 26.4 pg — ABNORMAL LOW (ref 26.6–33.0)
MCHC: 31.5 g/dL (ref 31.5–35.7)
MCV: 84 fL (ref 79–97)
Monocytes Absolute: 0.8 x10E3/uL (ref 0.1–0.9)
Monocytes: 9 %
Neutrophils Absolute: 5.3 x10E3/uL (ref 1.4–7.0)
Neutrophils: 61 %
Platelets: 363 x10E3/uL (ref 150–450)
RBC: 4.93 x10E6/uL (ref 3.77–5.28)
RDW: 12.4 % (ref 11.7–15.4)
WBC: 8.7 x10E3/uL (ref 3.4–10.8)

## 2024-03-21 LAB — IGE: IgE (Immunoglobulin E), Serum: 340 [IU]/mL (ref 6–495)

## 2024-03-22 ENCOUNTER — Ambulatory Visit: Payer: Self-pay | Admitting: Internal Medicine

## 2024-03-22 DIAGNOSIS — R058 Other specified cough: Secondary | ICD-10-CM

## 2024-03-24 NOTE — Progress Notes (Signed)
 Informed pt of her results, pt confirmed understanding. NFN

## 2024-03-27 NOTE — Progress Notes (Signed)
 Atc x1 regarding her questions that dr wert answered. lmtcb

## 2024-03-27 NOTE — Progress Notes (Signed)
 Spoke pt to relay results and she confirmed understanding, Pt also states that she has finished her preds and is wanting to be referred to allergy - still has cough  Do you want pt to keep her Oct 6th appt with you if she is seeing allergy ?

## 2024-03-28 NOTE — Telephone Encounter (Signed)
 Called to inform pt of dr leisa irby

## 2024-03-28 NOTE — Telephone Encounter (Signed)
 Copied from CRM [id number]. Topic: Clinical - Lab/Test Results >> Mar 27, 2024  4:16 PM Devaughn RAMAN wrote: Reason for CRM: Patient returning [facility] phone call regarding her results, contacted [facility] no answer. Advised patient Angel Humphrey will f/u with her . Patient was thankful and verbalized understanding.   See phone note

## 2024-04-14 ENCOUNTER — Ambulatory Visit: Payer: PRIVATE HEALTH INSURANCE | Admitting: Internal Medicine

## 2024-04-14 ENCOUNTER — Encounter: Payer: Self-pay | Admitting: Internal Medicine

## 2024-04-14 VITALS — BP 112/71 | HR 63 | Ht 63.0 in | Wt 193.8 lb

## 2024-04-14 DIAGNOSIS — R058 Other specified cough: Secondary | ICD-10-CM

## 2024-04-14 MED ORDER — BENZONATATE 200 MG PO CAPS: 200.0000 mg | ORAL_CAPSULE | Freq: Three times a day (TID) | ORAL | 2 refills | Status: AC | PRN

## 2024-04-14 NOTE — Patient Instructions (Addendum)
 Try zyrtec during the day and if too drowsy then clariton / allergra    If you are satisfied with your treatment plan,  let your doctor know and he/she can either refill your medications or you can return here when your prescription runs out.     If in any way you are not 100% satisfied,  please tell us .  If 100% better, tell your friends!  Pulmonary follow up is as needed

## 2024-04-14 NOTE — Progress Notes (Signed)
 Angel Humphrey, female    DOB: 09-Aug-1970    MRN: [id number]   Brief patient profile:  50   yowf  never smoker  referred to pulmonary clinic in [place]  03/19/2024 by [place]  for daily cough  x 2023  - works at [employer] in [place] where exposed to molds but coughs even on vacation   Has had allergies all her life all year round  Has GERD for years prior to onset of cough      History of Present Illness  03/19/2024  Pulmonary/ 1st office eval/ Angel Humphrey / Sales executive Complaint  Patient presents with   Establish Care   Cough    Dry cough x2 yrs - all hours  Dyspnea:  powerwalking the dog 15 min includes  Cough: not present on wakening but after AM Tea and driving to point of urinary retention sporadic  Sleep: 4 in bed blocks with one pillow some coughing but not that bad SABA use: albuterol did not help  02: none  Rec Take delsym(over the counter)  two tsp every 12 hours and supplement if needed with  tessalon  200 mg   Once you have eliminated the cough for 5 straight days try reducing tessalon  200   then the delsym as tolerated.   For drainage / throat tickle try take CHLORPHENIRAMINE  4 mg(over the counter) Prednisone  10 mg take  4 each am x 2 days,   2 each am x 2 days,  1 each am x 2 days and stop  Prilosec 40 mg Take 30- 60 min before your first and last meals of the day  Cxr ok but small HH    Allergy screen 03/19/2024 >  Eos 0.5 /  IgE  340 > rec  allergy eval     04/14/2024  f/u ov/[place] office/Angel Humphrey re: cough x sept 23 2023  maint on gerd rx/ no asthma meds  Chief Complaint  Patient presents with   Cough    F/u b4 allergy appt    Dyspnea:  Not limited by breathing from desired activities   Cough: asssoc with pnds Sleeping: 4 in bed blocks/ one pillow s resp cc p one H1  SABA use: none  02: none    No obvious day to day or daytime variability or assoc excess/ purulent sputum or mucus plugs or hemoptysis or cp or chest tightness, subjective wheeze  or overt  hb symptoms.    Also denies any obvious fluctuation of symptoms with weather or environmental changes or other aggravating or alleviating factors except as outlined above   No unusual exposure hx or h/o childhood pna/ asthma or knowledge of premature birth.  Current Allergies, Complete Past Medical History, Past Surgical History, Family History, and Social History were reviewed in [employer] record.  ROS  The following are not active complaints unless bolded Hoarseness, sore throat, dysphagia, dental problems, itching, sneezing,  nasal congestion or discharge of excess mucus or purulent secretions, ear ache,   fever, chills, sweats, unintended wt loss or wt gain, classically pleuritic or exertional cp,  orthopnea pnd or arm/hand swelling  or leg swelling, presyncope, palpitations, abdominal pain, anorexia, nausea, vomiting, diarrhea  or change in bowel habits or change in bladder habits, change in stools or change in urine, dysuria, hematuria,  rash, arthralgias, visual complaints, headache, numbness, weakness or ataxia or problems with walking or coordination,  change in mood or  memory.  Current Meds  Medication Sig   atorvastatin (LIPITOR) 20 MG tablet Take 20 mg by mouth daily.   benzonatate  (TESSALON ) 200 MG capsule Take 1 capsule (200 mg total) by mouth 3 (three) times daily as needed for cough.   diphenhydrAMINE  (BENADRYL ) 25 mg capsule Take 1 capsule (25 mg total) by mouth every 6 (six) hours as needed for itching or sleep.   hydrochlorothiazide  (HYDRODIURIL ) 25 MG tablet Take 25 mg by mouth every morning.   levocetirizine (XYZAL) 5 MG tablet Take 5 mg by mouth every evening.   losartan -hydrochlorothiazide  (HYZAAR) 50-12.5 MG tablet Take 1 tablet by mouth daily.   metFORMIN (GLUCOPHAGE-XR) 500 MG 24 hr tablet Take 500 mg by mouth 2 (two) times daily.   metoprolol succinate (TOPROL-XL) 50 MG 24 hr tablet Take 50 mg by mouth daily.   montelukast  (SINGULAIR) 10 MG tablet Take 10 mg by mouth daily.   naproxen sodium (ALEVE) 220 MG tablet Take 220 mg by mouth daily as needed.   omeprazole (PRILOSEC) 40 MG capsule Take 40 mg by mouth every morning.    ondansetron  (ZOFRAN ) 4 MG tablet Take 1 tablet (4 mg total) by mouth every 8 (eight) hours as needed for nausea or vomiting.           Past Medical History:  Diagnosis Date   Allergy    Baker's cyst of knee, right 08/2018   Claustrophobia    Diverticulosis    Elevated liver enzymes    Gallstones    GERD (gastroesophageal reflux disease)    Headache    Migraines   Hypertension    Interstitial cystitis    Jaundice    Lateral meniscal tear 08/2018   Right   OA (osteoarthritis) of knee 08/2018   Right   Periumbilical hernia 06/02/2017   small, per CT abdomen and pelvis   Periumbilical hernia 05/2017   PONV (postoperative nausea and vomiting)    Pre-diabetes    Transaminitis 06/12/2017   per Ultrasound   Uterine fibroid 2018      Objective:    Wt Readings from Last 3 Encounters:  04/14/24 193 lb 12.8 oz (87.9 kg)  03/19/24 192 lb 6.4 oz (87.3 kg)  07/13/23 190 lb (86.2 kg)      Vital signs reviewed  04/14/2024  - Note at rest 02 sats  94% on RA   General appearance:    all smiles/min throat clearing    HEENT : Oropharynx  clear          NECK :  without  apparent JVD/ palpable Nodes/TM    LUNGS: no acc muscle use,  Nl contour chest which is clear to A and P bilaterally without cough on insp or exp maneuvers   CV:  RRR  no s3 or murmur or increase in P2, and no edema   ABD:  soft and nontender   MS:  Gait nl   ext warm without deformities Or obvious joint restrictions  calf tenderness, cyanosis or clubbing    SKIN: warm and dry without lesions    NEURO:  alert, approp, nl sensorium with  no motor or cerebellar deficits apparent.         CXR PA and Lateral:   03/19/2024 :    I personally reviewed images and impression is as follows:     Mild  kyphosis/ minimal non-specific markings       Assessment    Assessment & Plan Upper airway cough syndrome Onset was Fall of 2023  provoked intermittently by deep insp maneuvers - Allergy screen 03/19/2024 >  Eos 0.5 /  IgE  340 >  rec allergy eval  - .03/19/2024 rx max gerd/ 1st gen H1 blockers per guidelines /pred x 6 d/ diet/tessalon  and delsym   Marked improvement though still throat clearing so rec    >>> continue present rx including gerdr x and f/u with allergy as planned - can use zyrtec in am and 1st gen H1 blockers per guidelines  at hs for now but follow allergy recs as to when to stop them before allergy testing   >>> tessalon  200  prn throat clearing if not responding to hard rock candy          Each maintenance medication was reviewed in detail including emphasizing most importantly the difference between maintenance and prns and under what circumstances the prns are to be triggered using an action plan format where appropriate.  Total time for H and P, chart review, counseling  and generating customized AVS unique to this office visit / same day charting = 21 min final f/u pulmonary ov            AVS  Patient Instructions  Try zyrtec during the day and if too drowsy then clariton / allergra    If you are satisfied with your treatment plan,  let your doctor know and he/she can either refill your medications or you can return here when your prescription runs out.     If in any way you are not 100% satisfied,  please tell us .  If 100% better, tell your friends!  Pulmonary follow up is as needed     Ozell America, MD 04/20/2024

## 2024-04-20 NOTE — Assessment & Plan Note (Addendum)
 Onset was Fall of 2023 provoked intermittently by deep insp maneuvers - Allergy screen 03/19/2024 >  Eos 0.5 /  IgE  340 >  rec allergy eval  - .03/19/2024 rx max gerd/ 1st gen H1 blockers per guidelines /pred x 6 d/ diet/tessalon  and delsym   Marked improvement though still throat clearing so rec    >>> continue present rx including gerdr x and f/u with allergy as planned - can use zyrtec in am and 1st gen H1 blockers per guidelines  at hs for now but follow allergy recs as to when to stop them before allergy testing   >>> tessalon  200  prn throat clearing if not responding to hard rock candy          Each maintenance medication was reviewed in detail including emphasizing most importantly the difference between maintenance and prns and under what circumstances the prns are to be triggered using an action plan format where appropriate.  Total time for H and P, chart review, counseling  and generating customized AVS unique to this office visit / same day charting = 21 min final f/u pulmonary ov

## 2024-05-28 ENCOUNTER — Encounter: Payer: Self-pay | Admitting: Allergy & Immunology

## 2024-05-28 ENCOUNTER — Ambulatory Visit: Payer: PRIVATE HEALTH INSURANCE | Admitting: Allergy & Immunology

## 2024-05-28 VITALS — BP 112/74 | HR 57 | Temp 98.2°F | Ht 63.0 in | Wt 191.4 lb

## 2024-05-28 DIAGNOSIS — J310 Chronic rhinitis: Secondary | ICD-10-CM | POA: Diagnosis not present

## 2024-05-28 DIAGNOSIS — R053 Chronic cough: Secondary | ICD-10-CM

## 2024-05-28 DIAGNOSIS — J4530 Mild persistent asthma, uncomplicated: Secondary | ICD-10-CM | POA: Diagnosis not present

## 2024-05-28 NOTE — Progress Notes (Unsigned)
 NEW PATIENT  Date of Service/Encounter:  05/28/24  Consult requested by: Donnise Norleen Lenis, MD   Assessment:   Chronic cough  Chronic rhinitis - planning for skin testing at the next visit  [facility] - 500 in September 2025  Plan/Recommendations:   1. Chronic cough  - This seems to be under good control with Dr. Darlean. - Continue with his management.   - Continue with omeprazole.   2. Chronic rhinitis - Because of insurance stipulations, we cannot do skin testing on the same day as your first visit. - We are all working to fight this, but for now we need to do two separate visits.  - We will know more after we do testing at the next visit.  - The skin testing visit can be squeezed in at your convenience.  - Then we can make a more full plan to address all of your symptoms. - Be sure to stop your antihistamines for 3 days before this appointment.   3. Return in about 2 weeks (around 06/11/2024) for SKIN TESTING (1-55) . You can have the follow up appointment with Dr. Iva or a Nurse Practicioner (our Nurse Practitioners are excellent and always have Physician oversight!).   Check out [facility]ANK [facility] COWDOG!   This note in its entirety was forwarded to the Provider who requested this consultation.  Subjective:   Angel Humphrey is a 53 y.o. female presenting today for evaluation of  Chief Complaint  Patient presents with   Establish Care    Pt is here to establish care, she has had an intermittent cough since October 2023. She has been seeing pulmonary for this but her pulmonologist referred her here for further management. Pulmonologist thinks cough is due to allergies. Pt states when she was a child she had allergy testing and took allergy shots but she is unsure what allergens she was allergic to.     Angel Humphrey has a history of the following: Patient Active Problem List   Diagnosis Date Noted   Upper airway cough syndrome 03/19/2024   Elevated LFTs 06/15/2017    Transaminitis    Jaundice 06/02/2017   Hypertension 06/02/2017   Interstitial cystitis 06/02/2017   Hypokalemia 06/02/2017   Acute pancreatitis 06/02/2017   UTI (urinary tract infection) 06/02/2017   Special screening for malignant neoplasms, colon 03/20/2017   Displaced fracture of distal phalanx of left ring finger, initial encounter for open fracture 11/09/2016   Uterine leiomyoma 02/21/2016    History obtained from: chart review and patient and her husband.  Discussed the use of AI scribe software for clinical note transcription with the patient and/or guardian, who gave verbal consent to proceed.  Angel Humphrey was referred by Donnise Norleen Lenis, MD.     Sharma is a 53 y.o. female presenting for an evaluation of environmental allergies.   Asthma/Respiratory Symptom History: She has had a cough since 2023. She did get her BP medication changes which did not help. CXR was normal. She has experienced a persistent dry cough for the past two years, beginning around October 2023. Despite various interventions, the cough has not fully resolved. Initially, her primary care doctor adjusted her high blood pressure medication and ordered a chest x-ray, which was normal. She was then referred to a pulmonologist who conducted further tests, including another x-ray that revealed a hiatal hernia, and blood work; she was told by the pulmonologist that allergies may be involved.  She is currently on a regimen of  prednisone , chlorpheniramine, Tessalon  Perles, and Delsym to manage her symptoms. Chlorpheniramine is taken an hour before bed due to its sedative effects, and Tessalon  Perles and Delsym are used as needed for cough relief. This combination has significantly reduced her coughing, though it persists to some extent. Albuterol provided minimal relief, and she has not been prescribed a daily inhaler.  The cough is dry and sometimes disturbs her sleep, causing her dog to leave the room. Her  husband mentioned that the cough was significantly reduced while on the medication regimen, decreasing by about 96-98%.  Allergic Rhinitis Symptom History: She has a history of taking allergy shots as a child but does not recall their effectiveness.   GERD Symptom History: She has been on omeprazole for years to manage reflux, which was not identified as a cause of her cough. She typically experiences two to three sinus infections annually.  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   Otherwise, there is no history of other atopic diseases, including drug allergies, stinging insect allergies, or contact dermatitis. There is no significant infectious history. Vaccinations are up to date.    Past Medical History: Patient Active Problem List   Diagnosis Date Noted   Upper airway cough syndrome 03/19/2024   Elevated LFTs 06/15/2017   Transaminitis    Jaundice 06/02/2017   Hypertension 06/02/2017   Interstitial cystitis 06/02/2017   Hypokalemia 06/02/2017   Acute pancreatitis 06/02/2017   UTI (urinary tract infection) 06/02/2017   Special screening for malignant neoplasms, colon 03/20/2017   Displaced fracture of distal phalanx of left ring finger, initial encounter for open fracture 11/09/2016   Uterine leiomyoma 02/21/2016    Medication List:  Allergies as of 05/28/2024   No Known Allergies      Medication List        Accurate as of May 28, 2024 11:59 PM. If you have any questions, ask your nurse or doctor.          atorvastatin 20 MG tablet Commonly known as: LIPITOR Take 20 mg by mouth daily.   benzonatate  200 MG capsule Commonly known as: TESSALON  Take 1 capsule (200 mg total) by mouth 3 (three) times daily as needed for cough.   chlorpheniramine 4 MG tablet Commonly known as: CHLOR-TRIMETON Take 4 mg by mouth at bedtime.    diphenhydrAMINE  25 mg capsule Commonly known as: BENADRYL  Take 1 capsule (25 mg total) by mouth every 6 (six) hours as needed for itching or sleep.   hydrochlorothiazide  25 MG tablet Commonly known as: HYDRODIURIL  Take 25 mg by mouth every morning.   levocetirizine 5 MG tablet Commonly known as: XYZAL Take 5 mg by mouth every evening.   losartan -hydrochlorothiazide  50-12.5 MG tablet Commonly known as: HYZAAR Take 1 tablet by mouth daily.   metFORMIN 500 MG 24 hr tablet Commonly known as: GLUCOPHAGE-XR Take 500 mg by mouth 2 (two) times daily.   metoprolol succinate 50 MG 24 hr tablet Commonly known as: TOPROL-XL Take 50 mg by mouth daily.   montelukast 10 MG tablet Commonly known as: SINGULAIR Take 10 mg by mouth daily. What changed:  when to take this reasons to take this   naproxen sodium 220 MG tablet Commonly known as: ALEVE Take 220 mg by mouth daily as needed.   omeprazole 40 MG capsule Commonly known as: PRILOSEC Take 40 mg by mouth every morning.   ondansetron  4 MG tablet  Commonly known as: ZOFRAN  Take 1 tablet (4 mg total) by mouth every 8 (eight) hours as needed for nausea or vomiting.   predniSONE  10 MG tablet Commonly known as: DELTASONE  Take  4 each am x 2 days,   2 each am x 2 days,  1 each am x 2 days and stop        Birth History: non-contributory  Developmental History: non-contributory  Past Surgical History: Past Surgical History:  Procedure Laterality Date   CESAREAN SECTION     CHOLECYSTECTOMY N/A 07/20/2017   Procedure: LAPAROSCOPIC CHOLECYSTECTOMY;  Surgeon: Rubin Calamity, MD;  Location: [facility] OR;  Service: General;  Laterality: N/A;   COLONOSCOPY  1998   FINGER SURGERY Left 11/2016   stitches   KNEE ARTHROSCOPY WITH MEDIAL MENISECTOMY Right 09/25/2018   Procedure: RIGHT KNEE ARTHROSCOPY WITH LATERAL  MENISECTOMY WITH CHONDRAPLASTY;  Surgeon: Shari Sieving, MD;  Location: [facility];  Service: Orthopedics;   Laterality: Right;   LIVER BIOPSY  2018     Family History: Family History  Problem Relation Age of Onset   Osteosarcoma Father    Graves' disease Father    Allergic rhinitis Sister    Colon cancer Neg Hx    Esophageal cancer Neg Hx    Rectal cancer Neg Hx    Stomach cancer Neg Hx    Colon polyps Neg Hx      Social History: Angel Humphrey lives at home with her family.  She lives in a house that is 55 years old.  There is hardwood throughout the home.  They have gas heating and central cooling.  There is 1 dog named Hank inside of the home.  There are no dust mite covers on the bedding.  There is no tobacco exposure.  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    Review of systems otherwise negative other than that mentioned in the HPI.    Objective:   Blood pressure 112/74, pulse (!) 57, temperature 98.2 F (36.8 C), temperature source Temporal, height 5' 3 (1.6 m), weight 191 lb 6.4 oz (86.8 kg), SpO2 97%. Body mass index is 33.9 kg/m.     Physical Exam Vitals reviewed.  Constitutional:      Appearance: She is well-developed.     Comments: Dry hacking cough.  HENT:     Head: Normocephalic and atraumatic.     Right Ear: Tympanic membrane, ear canal and external ear normal. No drainage, swelling or tenderness. Tympanic membrane is not injected, scarred, erythematous, retracted or bulging.     Left Ear: Tympanic membrane, ear canal and external ear normal. No drainage, swelling or tenderness. Tympanic membrane is not injected, scarred, erythematous, retracted or bulging.     Nose: No nasal deformity, septal deviation, mucosal edema or rhinorrhea.     Right Turbinates: Enlarged, swollen and pale.     Left Turbinates: Enlarged, swollen and pale.     Right Sinus: No maxillary sinus tenderness or frontal sinus tenderness.     Left Sinus: No maxillary sinus tenderness or frontal sinus tenderness.     Mouth/Throat:     Lips: Pink.     Mouth: Mucous  membranes are moist. Mucous membranes are not pale and not dry.     Pharynx: Uvula midline.     Comments: Cobblestoning present in the posterior oropharynx. Eyes:     General:        Right eye: No discharge.  Left eye: No discharge.     Conjunctiva/sclera: Conjunctivae normal.     Right eye: Right conjunctiva is not injected. No chemosis.    Left eye: Left conjunctiva is not injected. No chemosis.    Pupils: Pupils are equal, round, and reactive to light.  Cardiovascular:     Rate and Rhythm: Normal rate and regular rhythm.     Heart sounds: Normal heart sounds.  Pulmonary:     Effort: Pulmonary effort is normal. No tachypnea, accessory muscle usage or respiratory distress.     Breath sounds: Normal breath sounds. No wheezing, rhonchi or rales.  Chest:     Chest wall: No tenderness.  Abdominal:     Tenderness: There is no abdominal tenderness. There is no guarding or rebound.  Lymphadenopathy:     Head:     Right side of head: No submandibular, tonsillar or occipital adenopathy.     Left side of head: No submandibular, tonsillar or occipital adenopathy.     Cervical: No cervical adenopathy.  Skin:    General: Skin is warm.     Capillary Refill: Capillary refill takes less than 2 seconds.     Coloration: Skin is not pale.     Findings: No abrasion, erythema, petechiae or rash. Rash is not papular, urticarial or vesicular.  Neurological:     Mental Status: She is alert.  Psychiatric:        Behavior: Behavior is cooperative.      Diagnostic studies:   Spirometry: results normal (FEV1: 1.89/75%, FVC: 2.60/82%, FEV1/FVC: 73%).    Spirometry consistent with normal pattern.    Allergy Studies: deferred due to insurance stipulations that require a separate visit for testing        Marty Shaggy, MD [facility] 

## 2024-05-28 NOTE — Patient Instructions (Addendum)
 1. Chronic cough  - Lung testing looked excellent. - We will see what the testing shows, but we can be more aggressive with inhalers if we need to.  - This seems to be under good control with Dr. Darlean. - Continue with his management.   - Continue with omeprazole.   2. Chronic rhinitis - Because of insurance stipulations, we cannot do skin testing on the same day as your first visit. - We are all working to fight this, but for now we need to do two separate visits.  - We will know more after we do testing at the next visit.  - The skin testing visit can be squeezed in at your convenience.  - Then we can make a more full plan to address all of your symptoms. - Be sure to stop your antihistamines for 3 days before this appointment.   3. Return in about 2 weeks (around 06/11/2024) for SKIN TESTING (1-55) . You can have the follow up appointment with Dr. Iva or a Nurse Practicioner (our Nurse Practitioners are excellent and always have Physician oversight!).   Check out [facility]ANK [facility] COWDOG!    Please inform us  of any Emergency Department visits, hospitalizations, or changes in symptoms. Call us  before going to the ED for breathing or allergy symptoms since we might be able to fit you in for a sick visit. Feel free to contact us  anytime with any questions, problems, or concerns.  It was a pleasure to meet you and your hubs today!  Websites that have reliable patient information: 1. American Academy of Asthma, Allergy, and Immunology: www.aaaai.org 2. Food Allergy Research and Education (FARE): foodallergy.org 3. Mothers of Asthmatics: http://www.asthmacommunitynetwork.org 4. American College of Allergy, Asthma, and Immunology: www.acaai.org      "Like" us  on Facebook and Instagram for our latest updates!      A healthy democracy works best when [employer] participate! Make sure you are registered to vote! If you have moved or changed any of your contact information, you will need to  get this updated before voting! Scan the QR codes below to learn more!

## 2024-06-11 ENCOUNTER — Encounter: Payer: Self-pay | Admitting: Allergy & Immunology

## 2024-06-11 ENCOUNTER — Ambulatory Visit: Payer: PRIVATE HEALTH INSURANCE | Admitting: Allergy & Immunology

## 2024-06-11 DIAGNOSIS — J302 Other seasonal allergic rhinitis: Secondary | ICD-10-CM

## 2024-06-11 DIAGNOSIS — R053 Chronic cough: Secondary | ICD-10-CM

## 2024-06-11 DIAGNOSIS — J3089 Other allergic rhinitis: Secondary | ICD-10-CM

## 2024-06-11 NOTE — Progress Notes (Signed)
 FOLLOW UP  Date of Service/Encounter:  06/11/24   Assessment:   Chronic cough   Seasonal perennial allergic rhinitis (grasses, ragweed, weeds, dust mites, cat, and cockroach)   AEC - 500 in September 2025  Singulair responder  Plan/Recommendations:   1. Chronic cough  - Lung testing looked excellent at the last visit - We will see what the testing shows, but we can be more aggressive with inhalers if we need to.  - This seems to be under good control with Dr. Darlean. - Continue with his management.   - Continue with omeprazole.   2. Chronic rhinitis - Testing today showed: grasses, ragweed, weeds, dust mites, cat, and cockroach - Copy of test results provided.  - Avoidance measures provided. - Continue with: Xyzal (levocetirizine) 5mg  tablet once daily and chlorpheniramine 4 mg at night - You can use an extra dose of the antihistamine (levocetirizine), if needed, for breakthrough symptoms.  - Consider nasal saline rinses 1-2 times daily to remove allergens from the nasal cavities as well as help with mucous clearance (this is especially helpful to do before the nasal sprays are given) - Strongly consider allergy shots as a means of long-term control. - Allergy shots re-train and reset the immune system to ignore environmental allergens and decrease the resulting immune response to those allergens (sneezing, itchy watery eyes, runny nose, nasal congestion, etc).    - Allergy shots improve symptoms in 75-85% of patients.  - We can discuss more at the next appointment if the medications are not working for you. - This can help your cough, so much as your postnasal drip is contributing to the coughing.   3. Return in about 6 weeks (around 07/23/2024). You can have the follow up appointment with Dr. Iva or a Nurse Practicioner (our Nurse Practitioners are excellent and always have Physician oversight!).   Subjective:   Angel Humphrey is a 53 y.o. female presenting today  for follow up of No chief complaint on file.   Angel Humphrey has a history of the following: Patient Active Problem List   Diagnosis Date Noted   Upper airway cough syndrome 03/19/2024   Elevated LFTs 06/15/2017   Transaminitis    Jaundice 06/02/2017   Hypertension 06/02/2017   Interstitial cystitis 06/02/2017   Hypokalemia 06/02/2017   Acute pancreatitis 06/02/2017   UTI (urinary tract infection) 06/02/2017   Special screening for malignant neoplasms, colon 03/20/2017   Displaced fracture of distal phalanx of left ring finger, initial encounter for open fracture 11/09/2016   Uterine leiomyoma 02/21/2016    History obtained from: chart review and patient.  Discussed the use of AI scribe software for clinical note transcription with the patient and/or guardian, who gave verbal consent to proceed.  Angel Humphrey is a 53 y.o. female presenting for skin testing. She was last seen on November 19th. We could not do testing because her insurance company does not cover testing on the same day as a New Patient visit. She has been off of all antihistamines 3 days in anticipation of the testing.   Otherwise, there have been no changes to her past medical history, surgical history, family history, or social history.    Review of systems otherwise negative other than that mentioned in the HPI.    Objective:   There were no vitals taken for this visit. There is no height or weight on file to calculate BMI.    Physical exam deferred since this was a skin testing appointment only.  Diagnostic studies:    Allergy Studies:     Airborne Adult Perc - 06/11/24 1336     Time Antigen Placed 1336    Allergen Manufacturer Jestine    Location Back    Number of Test 55    1. Control-Buffer 50% Glycerol Negative    2. Control-Histamine 2+    3. Bahia Negative    4. Bermuda Negative    5. Johnson Negative    6. [place]  Blue 2+    7. Meadow Fescue 3+    8. Perennial Rye 4+    9. Timothy 4+     10. Ragweed Mix Negative    11. Cocklebur Negative    12. Plantain,  English 2+    13. Baccharis Negative    14. Dog Fennel Negative    15. Russian Thistle Negative    16. Lamb's Quarters 3+    17. Sheep Sorrell Negative    18. Rough Pigweed Negative    19. Marsh Elder, Rough Negative    20. Mugwort, Common Negative    21. Box, Elder Negative    22. Cedar, red Negative    23. Sweet Gum Negative    24. Pecan Pollen Negative    25. Pine Mix Negative    26. Walnut, Black Pollen Negative    27. Red Mulberry Negative    28. Ash Mix Negative    29. Birch Mix Negative    30. Beech American Negative    31. Cottonwood, Eastern Negative    32. Hickory, White Negative    33. Maple Mix Negative    34. Oak, Eastern Mix Negative    35. Sycamore Eastern Negative    36. Alternaria Alternata Negative    37. Cladosporium Herbarum Negative    38. Aspergillus Mix Negative    39. Penicillium Mix Negative    40. Bipolaris Sorokiniana (Helminthosporium) Negative    41. Drechslera Spicifera (Curvularia) Negative    42. Mucor Plumbeus Negative    43. Fusarium Moniliforme Negative    44. Aureobasidium Pullulans (pullulara) Negative    45. Rhizopus Oryzae Negative    46. Botrytis Cinera Negative    47. Epicoccum Nigrum Negative    48. Phoma Betae Negative    49. Dust Mite Mix Negative    50. Cat Hair 10,000 BAU/ml Negative    51.  Dog Epithelia Negative    52. Mixed Feathers Negative    53. Horse Epithelia Negative    54. Cockroach, German Negative    55. Tobacco Leaf Negative          Intradermal - 06/11/24 1427     Time Antigen Placed 1430    Allergen Manufacturer Jestine    Location Arm    Number of Test 13    Control Negative    Bahia 2+    Bermuda Negative    Johnson Negative    Ragweed Mix 3+    Tree Mix Negative    Mold 1 Negative    Mold 2 Negative    Mold 3 Negative    Mold 4 Negative    Mite Mix 4+    Cat 3+    Dog Negative    Cockroach 3+          Allergy  testing results were read and interpreted by myself, documented by clinical staff.      Marty Shaggy, MD  [facility] 

## 2024-06-11 NOTE — Patient Instructions (Addendum)
 1. Chronic cough  - Lung testing looked excellent at the last visit - We will see what the testing shows, but we can be more aggressive with inhalers if we need to.  - This seems to be under good control with Dr. Darlean. - Continue with his management.   - Continue with omeprazole.   2. Chronic rhinitis - Testing today showed: grasses, ragweed, weeds, dust mites, cat, and cockroach - Copy of test results provided.  - Avoidance measures provided. - Continue with: Xyzal (levocetirizine) 5mg  tablet once daily and chlorpheniramine 4 mg at night - You can use an extra dose of the antihistamine (levocetirizine), if needed, for breakthrough symptoms.  - Consider nasal saline rinses 1-2 times daily to remove allergens from the nasal cavities as well as help with mucous clearance (this is especially helpful to do before the nasal sprays are given) - Strongly consider allergy shots as a means of long-term control. - Allergy shots re-train and reset the immune system to ignore environmental allergens and decrease the resulting immune response to those allergens (sneezing, itchy watery eyes, runny nose, nasal congestion, etc).    - Allergy shots improve symptoms in 75-85% of patients.  - We can discuss more at the next appointment if the medications are not working for you. - This can help your cough, so much as your postnasal drip is contributing to the coughing.   3. Return in about 6 weeks (around 07/23/2024). You can have the follow up appointment with Dr. Iva or a Nurse Practicioner (our Nurse Practitioners are excellent and always have Physician oversight!).     Please inform us  of any Emergency Department visits, hospitalizations, or changes in symptoms. Call us  before going to the ED for breathing or allergy symptoms since we might be able to fit you in for a sick visit. Feel free to contact us  anytime with any questions, problems, or concerns.  It was a pleasure to meet you and your  hubs today!  Websites that have reliable patient information: 1. American Academy of Asthma, Allergy, and Immunology: www.aaaai.org 2. Food Allergy Research and Education (FARE): foodallergy.org 3. Mothers of Asthmatics: http://www.asthmacommunitynetwork.org 4. American College of Allergy, Asthma, and Immunology: www.acaai.org      "Like" us  on Facebook and Instagram for our latest updates!      A healthy democracy works best when [employer] participate! Make sure you are registered to vote! If you have moved or changed any of your contact information, you will need to get this updated before voting! Scan the QR codes below to learn more!        Airborne Adult Perc - 06/11/24 1336     Time Antigen Placed 1336    Allergen Manufacturer Jestine    Location Back    Number of Test 55    1. Control-Buffer 50% Glycerol Negative    2. Control-Histamine 2+    3. Bahia Negative    4. Bermuda Negative    5. Johnson Negative    6. [place]  Blue 2+    7. Meadow Fescue 3+    8. Perennial Rye 4+    9. Timothy 4+    10. Ragweed Mix Negative    11. Cocklebur Negative    12. Plantain,  English 2+    13. Baccharis Negative    14. Dog Fennel Negative    15. Russian Thistle Negative    16. Lamb's Quarters 3+    17. Sheep Sorrell Negative    18. Rough  Pigweed Negative    19. Marsh Elder, Rough Negative    20. Mugwort, Common Negative    21. Box, Elder Negative    22. Cedar, red Negative    23. Sweet Gum Negative    24. Pecan Pollen Negative    25. Pine Mix Negative    26. Walnut, Black Pollen Negative    27. Red Mulberry Negative    28. Ash Mix Negative    29. Birch Mix Negative    30. Beech American Negative    31. Cottonwood, Eastern Negative    32. Hickory, White Negative    33. Maple Mix Negative    34. Oak, Eastern Mix Negative    35. Sycamore Eastern Negative    36. Alternaria Alternata Negative    37. Cladosporium Herbarum Negative    38. Aspergillus Mix Negative     39. Penicillium Mix Negative    40. Bipolaris Sorokiniana (Helminthosporium) Negative    41. Drechslera Spicifera (Curvularia) Negative    42. Mucor Plumbeus Negative    43. Fusarium Moniliforme Negative    44. Aureobasidium Pullulans (pullulara) Negative    45. Rhizopus Oryzae Negative    46. Botrytis Cinera Negative    47. Epicoccum Nigrum Negative    48. Phoma Betae Negative    49. Dust Mite Mix Negative    50. Cat Hair 10,000 BAU/ml Negative    51.  Dog Epithelia Negative    52. Mixed Feathers Negative    53. Horse Epithelia Negative    54. Cockroach, German Negative    55. Tobacco Leaf Negative          Intradermal - 06/11/24 1427     Time Antigen Placed 1430    Allergen Manufacturer Jestine    Location Arm    Number of Test 13    Control Negative    Bahia 2+    Bermuda Negative    Johnson Negative    Ragweed Mix 3+    Tree Mix Negative    Mold 1 Negative    Mold 2 Negative    Mold 3 Negative    Mold 4 Negative    Mite Mix 4+    Cat 3+    Dog Negative    Cockroach 3+          Reducing Pollen Exposure  The American Academy of Allergy, Asthma and Immunology suggests the following steps to reduce your exposure to pollen during allergy seasons.    Do not hang sheets or clothing out to dry; pollen may collect on these items. Do not mow lawns or spend time around freshly cut grass; mowing stirs up pollen. Keep windows closed at night.  Keep car windows closed while driving. Minimize morning activities outdoors, a time when pollen counts are usually at their highest. Stay indoors as much as possible when pollen counts or humidity is high and on windy days when pollen tends to remain in the air longer. Use air conditioning when possible.  Many air conditioners have filters that trap the pollen spores. Use a HEPA room air filter to remove pollen form the indoor air you breathe.  Control of Dust Mite Allergen    Dust mites play a major role in allergic asthma  and rhinitis.  They occur in environments with high humidity wherever human skin is found.  Dust mites absorb humidity from the atmosphere (ie, they do not drink) and feed on organic matter (including shed human and animal skin).  Dust mites are  a microscopic type of insect that you cannot see with the naked eye.  High levels of dust mites have been detected from mattresses, pillows, carpets, upholstered furniture, bed covers, clothes, soft toys and any woven material.  The principal allergen of the dust mite is found in its feces.  A gram of dust may contain 1,000 mites and 250,000 fecal particles.  Mite antigen is easily measured in the air during house cleaning activities.  Dust mites do not bite and do not cause harm to humans, other than by triggering allergies/asthma.    Ways to decrease your exposure to dust mites in your home:  Encase mattresses, box springs and pillows with a mite-impermeable barrier or cover   Wash sheets, blankets and drapes weekly in hot water (130 F) with detergent and dry them in a dryer on the hot setting.  Have the room cleaned frequently with a vacuum cleaner and a damp dust-mop.  For carpeting or rugs, vacuuming with a vacuum cleaner equipped with a high-efficiency particulate air (HEPA) filter.  The dust mite allergic individual should not be in a room which is being cleaned and should wait 1 hour after cleaning before going into the room. Do not sleep on upholstered furniture (eg, couches).   If possible removing carpeting, upholstered furniture and drapery from the home is ideal.  Horizontal blinds should be eliminated in the rooms where the person spends the most time (bedroom, study, television room).  Washable vinyl, roller-type shades are optimal. Remove all non-washable stuffed toys from the bedroom.  Wash stuffed toys weekly like sheets and blankets above.   Reduce indoor humidity to less than 50%.  Inexpensive humidity monitors can be purchased at most hardware  stores.  Do not use a humidifier as can make the problem worse and are not recommended.  Control of Dog or Cat Allergen  Avoidance is the best way to manage a dog or cat allergy. If you have a dog or cat and are allergic to dog or cats, consider removing the dog or cat from the home. If you have a dog or cat but don't want to find it a new home, or if your family wants a pet even though someone in the household is allergic, here are some strategies that may help keep symptoms at bay:  Keep the pet out of your bedroom and restrict it to only a few rooms. Be advised that keeping the dog or cat in only one room will not limit the allergens to that room. Don't pet, hug or kiss the dog or cat; if you do, wash your hands with soap and water. High-efficiency particulate air (HEPA) cleaners run continuously in a bedroom or living room can reduce allergen levels over time. Regular use of a high-efficiency vacuum cleaner or a central vacuum can reduce allergen levels. Giving your dog or cat a bath at least once a week can reduce airborne allergen.  Control of Cockroach Allergen  Cockroach allergen has been identified as an important cause of acute attacks of asthma, especially in urban settings.  There are fifty-five species of cockroach that exist in the [place] , however only three, the American, German and Oriental species produce allergen that can affect patients with Asthma.  Allergens can be obtained from fecal particles, egg casings and se discharges) cretions from cockroaches.    Remove food sources. Reduce access to water. Seal access and entry points. Spray runways with 0.5-1% Diazinon or Chlorpyrifos Blow boric acid power under stoves and  refrigerator. Place bait stations (hydramethylnon) at feeding sites.  Allergy Shots  Allergies are the result of a chain reaction that starts in the immune system. Your immune system controls how your body defends itself. For instance, if you have an  allergy to pollen, your immune system identifies pollen as an invader or allergen. Your immune system overreacts by producing antibodies called Immunoglobulin E (IgE). These antibodies travel to cells that release chemicals, causing an allergic reaction.  The concept behind allergy immunotherapy, whether it is received in the form of shots or tablets, is that the immune system can be desensitized to specific allergens that trigger allergy symptoms. Although it requires time and patience, the payback can be long-term relief. Allergy injections contain a dilute solution of those substances that you are allergic to based upon your skin testing and allergy history.   How Do Allergy Shots Work?  Allergy shots work much like a vaccine. Your body responds to injected amounts of a particular allergen given in increasing doses, eventually developing a resistance and tolerance to it. Allergy shots can lead to decreased, minimal or no allergy symptoms.  There generally are two phases: build-up and maintenance. Build-up often ranges from three to six months and involves receiving injections with increasing amounts of the allergens. The shots are typically given once or twice a week, though more rapid build-up schedules are sometimes used.  The maintenance phase begins when the most effective dose is reached. This dose is different for each person, depending on how allergic you are and your response to the build-up injections. Once the maintenance dose is reached, there are longer periods between injections, typically two to four weeks.  Occasionally doctors give cortisone-type shots that can temporarily reduce allergy symptoms. These types of shots are different and should not be confused with allergy immunotherapy shots.  Who Can Be Treated with Allergy Shots?  Allergy shots may be a good treatment approach for people with allergic rhinitis (hay fever), allergic asthma, conjunctivitis (eye allergy) or stinging  insect allergy.   Before deciding to begin allergy shots, you should consider:   The length of allergy season and the severity of your symptoms  Whether medications and/or changes to your environment can control your symptoms  Your desire to avoid long-term medication use  Time: allergy immunotherapy requires a major time commitment  Cost: may vary depending on your insurance coverage  Allergy shots for children age 24 and older are effective and often well tolerated. They might prevent the onset of new allergen sensitivities or the progression to asthma.  Allergy shots are not started on patients who are pregnant but can be continued on patients who become pregnant while receiving them. In some patients with other medical conditions or who take certain common medications, allergy shots may be of risk. It is important to mention other medications you talk to your allergist.   What are the two types of build-ups offered:   RUSH or Rapid Desensitization -- one day of injections lasting from 8:30-4:30pm, injections every 1 hour.  Approximately half of the build-up process is completed in that one day.  The following week, normal build-up is resumed, and this entails ~16 visits either weekly or twice weekly, until reaching your "maintenance dose" which is continued weekly until eventually getting spaced out to every month for a duration of 3 to 5 years. The regular build-up appointments are nurse visits where the injections are administered, followed by required monitoring for 30 minutes.    Traditional build-up --  weekly visits for 6 -12 months until reaching "maintenance dose", then continue weekly until eventually spacing out to every 4 weeks as above. At these appointments, the injections are administered, followed by required monitoring for 30 minutes.     Either way is acceptable, and both are equally effective. With the rush protocol, the advantage is that less time is spent here for  injections overall AND you would also reach maintenance dosing faster (which is when the clinical benefit starts to become more apparent). Not everyone is a candidate for rapid desensitization.   IF we proceed with the RUSH protocol, there are premedications which must be taken the day before and the day after the rush only (this includes antihistamines, steroids, and Singulair).  After the rush day, no prednisone  or Singulair is required, and we just recommend antihistamines taken on your injection day.  What Is An Estimate of the Costs?  If you are interested in starting allergy injections, please check with your insurance company about your coverage for both allergy vial sets and allergy injections.  Please do so prior to making the appointment to start injections.  The following are CPT codes to give to your insurance company. These are the amounts we BILL to the insurance company, but the amount YOU WILL PAY and WE RECEIVE IS SUBSTANTIALLY LESS and depends on the contracts we have with different insurance companies.   Amount Billed to Insurance One allergy vial set  CPT 95165   $ 1200     Two allergy vial set  CPT 95165   $ 2400     Three allergy vial set  CPT 95165   $ 3600     One injection   CPT 95115   $ 35  Two injections   CPT 95117   $ 40 RUSH (Rapid Desensitization) CPT 95180 x 8 hours $500/hour  Regarding the allergy injections, your co-pay may or may not apply with each injection, so please confirm this with your insurance company. When you start allergy injections, 1 or 2 sets of vials are made based on your allergies.  Not all patients can be on one set of vials. A set of vials lasts 6 months to a year depending on how quickly you can proceed with your build-up of your allergy injections. Vials are personalized for each patient depending on their specific allergens.  How often are allergy injection given during the build-up period?   Injections are given at least weekly during  the build-up period until your maintenance dose is achieved. Per the doctor's discretion, you may have the option of getting allergy injections two times per week during the build-up period. However, there must be at least 48 hours between injections. The build-up period is usually completed within 6-12 months depending on your ability to schedule injections and for adjustments for reactions. When maintenance dose is reached, your injection schedule is gradually changed to every two weeks and later to every three weeks. Injections will then continue every 4 weeks. Usually, injections are continued for a total of 3-5 years.   When Will I Feel Better?  Some may experience decreased allergy symptoms during the build-up phase. For others, it may take as long as 12 months on the maintenance dose. If there is no improvement after a year of maintenance, your allergist will discuss other treatment options with you.  If you aren't responding to allergy shots, it may be because there is not enough dose of the allergen in your vaccine or  there are missing allergens that were not identified during your allergy testing. Other reasons could be that there are high levels of the allergen in your environment or major exposure to non-allergic triggers like tobacco smoke.  What Is the Length of Treatment?  Once the maintenance dose is reached, allergy shots are generally continued for three to five years. The decision to stop should be discussed with your allergist at that time. Some people may experience a permanent reduction of allergy symptoms. Others may relapse and a longer course of allergy shots can be considered.  What Are the Possible Reactions?  The two types of adverse reactions that can occur with allergy shots are local and systemic. Common local reactions include very mild redness and swelling at the injection site, which can happen immediately or several hours after. Report a delayed reaction from your  last injection. These include arm swelling or runny nose, watery eyes or cough that occurs within 12-24 hours after injection. A systemic reaction, which is less common, affects the entire body or a particular body system. They are usually mild and typically respond quickly to medications. Signs include increased allergy symptoms such as sneezing, a stuffy nose or hives.   Rarely, a serious systemic reaction called anaphylaxis can develop. Symptoms include swelling in the throat, wheezing, a feeling of tightness in the chest, nausea or dizziness. Most serious systemic reactions develop within 30 minutes of allergy shots. This is why it is strongly recommended you wait in your doctor's office for 30 minutes after your injections. Your allergist is trained to watch for reactions, and his or her staff is trained and equipped with the proper medications to identify and treat them.   Report to the nurse immediately if you experience any of the following symptoms: swelling, itching or redness of the skin, hives, watery eyes/nose, breathing difficulty, excessive sneezing, coughing, stomach pain, diarrhea, or light headedness. These symptoms may occur within 15-20 minutes after injection and may require medication.   Who Should Administer Allergy Shots?  The preferred location for receiving shots is your prescribing allergist's office. Injections can sometimes be given at another facility where the physician and staff are trained to recognize and treat reactions, and have received instructions by your prescribing allergist.  What if I am late for an injection?   Injection dose will be adjusted depending upon how many days or weeks you are late for your injection.   What if I am sick?   Please report any illness to the nurse before receiving injections. She may adjust your dose or postpone injections depending on your symptoms. If you have fever, flu, sinus infection or chest congestion it is best to postpone  allergy injections until you are better. Never get an allergy injection if your asthma is causing you problems. If your symptoms persist, seek out medical care to get your health problem under control.  What If I am or Become Pregnant:  Women that become pregnant should schedule an appointment with [facility] before receiving any further allergy injections.

## 2024-07-23 ENCOUNTER — Encounter: Payer: Self-pay | Admitting: Family Medicine

## 2024-07-23 ENCOUNTER — Other Ambulatory Visit: Payer: Self-pay

## 2024-07-23 ENCOUNTER — Ambulatory Visit (INDEPENDENT_AMBULATORY_CARE_PROVIDER_SITE_OTHER): Payer: PRIVATE HEALTH INSURANCE | Admitting: Family Medicine

## 2024-07-23 VITALS — BP 112/80 | HR 70 | Temp 98.1°F | Resp 18 | Ht 63.0 in | Wt 188.0 lb

## 2024-07-23 DIAGNOSIS — J4530 Mild persistent asthma, uncomplicated: Secondary | ICD-10-CM

## 2024-07-23 DIAGNOSIS — J302 Other seasonal allergic rhinitis: Secondary | ICD-10-CM | POA: Diagnosis not present

## 2024-07-23 DIAGNOSIS — J3089 Other allergic rhinitis: Secondary | ICD-10-CM

## 2024-07-23 DIAGNOSIS — R053 Chronic cough: Secondary | ICD-10-CM

## 2024-07-23 DIAGNOSIS — K219 Gastro-esophageal reflux disease without esophagitis: Secondary | ICD-10-CM

## 2024-07-23 NOTE — Patient Instructions (Signed)
 Allergic rhinitis Continue allergen avoidance measures directed toward grass pollen, weed pollen, ragweed pollen, dust mite, cat, and cockroach as listed below Continue Xyzal 5 mg once a day if needed for runny nose or itch.  You may take an additional dose of Xyzal 5 mg once a day if needed for breakthrough symptoms.  Continue chlorphentermine 4 mg at bedtime Consider saline nasal rinses as needed for nasal symptoms. Use this before any medicated nasal sprays for best result Consider allergen immunotherapy if your symptoms are not well-controlled with the treatment plan as listed above. Make an appointment for your first allergy  injection if you are interested in this option  Chronic cough Continue to follow-up with your pulmonology specialist Continue omeprazole daily as previously prescribed  Call the clinic if this treatment plan is not working well for you.  Follow up in 3 months or sooner if needed.  Reducing Pollen Exposure The American [employer] of [employer] , [employer] and [employer] suggests the following steps to reduce your exposure to pollen during allergy  seasons. Do not hang sheets or clothing out to dry; pollen may collect on these items. Do not mow lawns or spend time around freshly cut grass; mowing stirs up pollen. Keep windows closed at night.  Keep car windows closed while driving. Minimize morning activities outdoors, a time when pollen counts are usually at their highest. Stay indoors as much as possible when pollen counts or humidity is high and on windy days when pollen tends to remain in the air longer. Use air conditioning when possible.  Many air conditioners have filters that trap the pollen spores. Use a HEPA room air filter to remove pollen form the indoor air you breathe.   Control of Dust Mite Allergen Dust mites play a major role in allergic asthma and rhinitis. They occur in environments with high humidity wherever human skin is found. Dust mites absorb humidity  from the atmosphere (ie, they do not drink) and feed on organic matter (including shed human and animal skin). Dust mites are a microscopic type of insect that you cannot see with the naked eye. High levels of dust mites have been detected from mattresses, pillows, carpets, upholstered furniture, bed covers, clothes, soft toys and any woven material. The principal allergen of the dust mite is found in its feces. A gram of dust may contain 1,000 mites and 250,000 fecal particles. Mite antigen is easily measured in the air during house cleaning activities. Dust mites do not bite and do not cause harm to humans, other than by triggering allergies/asthma.  Ways to decrease your exposure to dust mites in your home:  1. Encase mattresses, box springs and pillows with a mite-impermeable barrier or cover  2. Wash sheets, blankets and drapes weekly in hot water (130 F) with detergent and dry them in a dryer on the hot setting.  3. Have the room cleaned frequently with a vacuum cleaner and a damp dust-mop. For carpeting or rugs, vacuuming with a vacuum cleaner equipped with a high-efficiency particulate air (HEPA) filter. The dust mite allergic individual should not be in a room which is being cleaned and should wait 1 hour after cleaning before going into the room.  4. Do not sleep on upholstered furniture (eg, couches).  5. If possible removing carpeting, upholstered furniture and drapery from the home is ideal. Horizontal blinds should be eliminated in the rooms where the person spends the most time (bedroom, study, television room). Washable vinyl, roller-type shades are optimal.  6. Remove all  non-washable stuffed toys from the bedroom. Wash stuffed toys weekly like sheets and blankets above.  7. Reduce indoor humidity to less than 50%. Inexpensive humidity monitors can be purchased at most hardware stores. Do not use a humidifier as can make the problem worse and are not recommended.  Control of Dog  or Cat Allergen Avoidance is the best way to manage a dog or cat allergy . If you have a dog or cat and are allergic to dog or cats, consider removing the dog or cat from the home. If you have a dog or cat but dont want to find it a new home, or if your family wants a pet even though someone in the household is allergic, here are some strategies that may help keep symptoms at bay:  Keep the pet out of your bedroom and restrict it to only a few rooms. Be advised that keeping the dog or cat in only one room will not limit the allergens to that room. Dont pet, hug or kiss the dog or cat; if you do, wash your hands with soap and water. High-efficiency particulate air (HEPA) cleaners run continuously in a bedroom or living room can reduce allergen levels over time. Regular use of a high-efficiency vacuum cleaner or a central vacuum can reduce allergen levels. Giving your dog or cat a bath at least once a week can reduce airborne allergen.  Control of Cockroach Allergen Cockroach allergen has been identified as an important cause of acute attacks of asthma, especially in urban settings.  There are fifty-five species of cockroach that exist in the [place] , however only three, the American, German and Oriental species produce allergen that can affect patients with Asthma.  Allergens can be obtained from fecal particles, egg casings and secretions from cockroaches.    Remove food sources. Reduce access to water. Seal access and entry points. Spray runways with 0.5-1% Diazinon or Chlorpyrifos Blow boric acid power under stoves and refrigerator. Place bait stations (hydramethylnon) at feeding sites.

## 2024-07-23 NOTE — Progress Notes (Signed)
 "  [address] [place] [address] Dept: [phone]  FOLLOW UP NOTE  Patient ID: Angel Humphrey, female    DOB: June 10, 1971  Age: 54 y.o. MRN: [id number] Date of Office Visit: 07/23/2024  Assessment  Chief Complaint: Follow-up (Pt presents to the office for a 6 week follow up. Cough has gotten better but still there.)  HPI Angel Humphrey is a 54 year old female who presents to the clinic for follow-up visit.  She was last seen in this clinic on 06/11/2024 by Dr. Iva for evaluation of allergic rhinitis, cough, and reflux.  Discussed the use of AI scribe software for clinical note transcription with the patient, who gave verbal consent to proceed.  History of Present Illness Angel Humphrey is a 54 year old female who presents with a chronic cough and allergy  symptoms.  She has experienced a persistent dry cough that began two years ago, occurring more frequently during the day than at night. Occasional wheezing and dizziness follow coughing episodes, but there is no shortness of breath or sputum production.  She reports that she rarely uses her albuterol inhaler and even when used, does not alleviate the cough. She denies any new environmental changes such as new pets or carpets. She works in an old building initially suspected to have mold, but no mold was found.  She has a history of gastroesophageal reflux disease (GERD) managed with omeprazole taken once daily, 30 to 60 minutes before her first meal. Elevating the head of her bed by four inches aids in symptom control.  She denies symptoms of reflux including heartburn or vomiting at this time.  Allergy  symptoms include sneezing and a runny nose, attributed to weather changes. She takes Xyzal (levocetirizine) in the evening and chlorpheniramine before bed. Previous evaluations by a pulmonologist included treatments with steroids, Periactin, Delsym, and Tessalon  Perles, which provided temporary relief.  Her last  environmental allergy  skin testing on 06/11/2024 was positive to grass pollen, weed pollen, ragweed pollen, dust mite mix, cat, and cockroach.  We discussed allergen immunotherapy in detail including risks and benefits in addition to timing of therapy and length of therapy.  She is interested in possibly starting allergen immunotherapy in the near future.  Written information was provided regarding codes for insurance.  Her current medications are listed in the chart.   Drug Allergies:  Allergies[1]  Physical Exam: BP 112/80 (BP Location: Right Arm, Patient Position: Sitting, Cuff Size: Normal)   Pulse 70   Temp 98.1 F (36.7 C) (Temporal)   Resp 18   Ht 5' 3 (1.6 m)   Wt 188 lb (85.3 kg)   SpO2 97%   BMI 33.30 kg/m    Physical Exam Vitals reviewed.  Constitutional:      Appearance: Normal appearance.  HENT:     Head: Normocephalic and atraumatic.     Right Ear: Tympanic membrane normal.     Left Ear: Tympanic membrane normal.     Nose:     Comments: Bilateral nares slightly erythematous with thin clear nasal drainage noted.  Pharynx normal.  Ears normal.  Eyes normal.    Mouth/Throat:     Pharynx: Oropharynx is clear.  Eyes:     Conjunctiva/sclera: Conjunctivae normal.  Cardiovascular:     Rate and Rhythm: Normal rate and regular rhythm.     Heart sounds: Normal heart sounds. No murmur heard. Pulmonary:     Effort: Pulmonary effort is normal.     Breath sounds: Normal breath sounds.  Comments: Lungs clear to auscultation Musculoskeletal:        General: Normal range of motion.     Cervical back: Normal range of motion and neck supple.  Skin:    General: Skin is warm and dry.  Neurological:     Mental Status: She is alert and oriented to person, place, and time.  Psychiatric:        Mood and Affect: Mood normal.        Behavior: Behavior normal.        Thought Content: Thought content normal.        Judgment: Judgment normal.     Assessment and Plan: 1.  Mild persistent asthma, uncomplicated   2. Chronic cough   3. Seasonal and perennial allergic rhinitis   4. Gastroesophageal reflux disease, unspecified whether esophagitis present     Patient Instructions  Allergic rhinitis Continue allergen avoidance measures directed toward grass pollen, weed pollen, ragweed pollen, dust mite, cat, and cockroach as listed below Continue Xyzal 5 mg once a day if needed for runny nose or itch.  You may take an additional dose of Xyzal 5 mg once a day if needed for breakthrough symptoms.  Continue chlorphentermine 4 mg at bedtime Consider saline nasal rinses as needed for nasal symptoms. Use this before any medicated nasal sprays for best result Consider allergen immunotherapy if your symptoms are not well-controlled with the treatment plan as listed above. Make an appointment for your first allergy  injection if you are interested in this option  Chronic cough Continue to follow-up with your pulmonology specialist Continue omeprazole daily as previously prescribed  Call the clinic if this treatment plan is not working well for you.  Follow up in 3 months or sooner if needed.  Return in about 3 months (around 10/21/2024), or if symptoms worsen or fail to improve.    Thank you for the opportunity to care for this patient.  Please do not hesitate to contact me with questions.  Arlean Mutter, FNP [facility]           [1] No Known Allergies  "

## 2024-07-25 ENCOUNTER — Encounter: Payer: Self-pay | Admitting: Family Medicine

## 2024-08-08 NOTE — Addendum Note (Signed)
 Addended by: IVA MARTY SALTNESS on: 08/08/2024 05:07 PM   Modules accepted: Orders

## 2024-08-13 ENCOUNTER — Ambulatory Visit: Payer: PRIVATE HEALTH INSURANCE

## 2024-08-13 NOTE — Progress Notes (Signed)
 Aeroallergen Immunotherapy  Ordering Provider: Marty Shaggy, MD  Patient Details Name: MEGGIN OLA MRN: [id number] Date of Birth: 20-Oct-1970  Order 2 of 2  Vial Label: RW/DM/CR  0.6 ml (Volume)  1:20 Concentration -- Ragweed Mix 1.0 ml (Volume)   AU Concentration -- Mite Mix (DF 5,000 & DP 5,000) 0.3 ml (Volume)  1:20 Concentration -- Cockroach, German   1.9  ml Extract Subtotal 3.1  ml Diluent  5.0  ml Maintenance Total  Schedule:  B  Silver Vial (1:10,000): Schedule B (6 doses) Green Vial (1:1,000): Schedule B (6 doses) Blue Vial (1:100): Schedule B (6 doses) Yellow Vial (1:10): Schedule B (6 doses) Red Vial (1:1): Schedule A (14 doses)   Special Instructions: After completion of the first Red Vial, please space to every two weeks. After completion of the second Red Vial, please space to every 4 weeks. Ok to up dose new vials on Schedule D. Ok to come twice weekly, if desired, as long as there is 48 hours between injections.

## 2024-08-13 NOTE — Addendum Note (Signed)
 Addended by: Tonjua Rossetti E on: 08/13/2024 10:33 AM   Modules accepted: Orders

## 2024-08-13 NOTE — Progress Notes (Signed)
 Aeroallergen Immunotherapy  Ordering Provider: Marty Shaggy, MD  Patient Details Name: Angel Humphrey MRN: [id number] Date of Birth: 10/01/1970  Order 1 of 2  Vial Label: G/W/C  0.3 ml (Volume)  BAU Concentration -- 7 Grass Mix* 100,000 ([place]  Blue, [place], Orchard, Perennial R[place], RedTop, Sweet Vernal, Timothy) 0.2 ml (Volume)  1:20 Concentration -- [place] 0.2 ml (Volume)  1:20 Concentration -- Lamb's Quarters* 0.5 ml (Volume)  1:20 Concentration -- Weed Mix* 1.0 ml (Volume)  1:10 Concentration -- Cat Hair   2.2  ml Extract Subtotal 2.8  ml Diluent  5.0  ml Maintenance Total  Schedule:  B  Silver Vial (1:10,000): Schedule B (6 doses) Green Vial (1:1,000): Schedule B (6 doses) Blue Vial (1:100): Schedule B (6 doses) Yellow Vial (1:10): Schedule B (6 doses) Red Vial (1:1): Schedule A (14 doses)  Special Instructions: After completion of the first Red Vial, please space to every two weeks. After completion of the second Red Vial, please space to every 4 weeks. Ok to up dose new vials on Schedule D. Ok to come twice weekly, if desired, as long as there is 48 hours between injections.

## 2024-08-13 NOTE — Progress Notes (Signed)
 VIALS MADE ON 08/13/24

## 2024-08-22 ENCOUNTER — Ambulatory Visit: Payer: PRIVATE HEALTH INSURANCE

## 2024-10-22 ENCOUNTER — Ambulatory Visit: Payer: PRIVATE HEALTH INSURANCE | Admitting: Family Medicine
